# Patient Record
Sex: Female | Born: 1985 | Hispanic: Yes | Marital: Married | Smoking: Never smoker
Health system: Southern US, Community
[De-identification: ages and names within clinical notes are randomized; demographics above are authoritative.]

## PROBLEM LIST (undated history)

## (undated) DIAGNOSIS — Z789 Other specified health status: Secondary | ICD-10-CM

## (undated) HISTORY — PX: NO PAST SURGERIES: SHX2092

## (undated) HISTORY — PX: WISDOM TOOTH EXTRACTION: SHX21

---

## 2006-02-28 ENCOUNTER — Emergency Department (HOSPITAL_COMMUNITY): Admission: EM | Admit: 2006-02-28 | Discharge: 2006-02-28 | Payer: Self-pay | Admitting: Emergency Medicine

## 2007-10-14 ENCOUNTER — Ambulatory Visit: Payer: Self-pay | Admitting: Family Medicine

## 2007-10-14 ENCOUNTER — Encounter (INDEPENDENT_AMBULATORY_CARE_PROVIDER_SITE_OTHER): Payer: Self-pay | Admitting: Internal Medicine

## 2007-10-14 ENCOUNTER — Ambulatory Visit: Payer: Self-pay | Admitting: *Deleted

## 2007-10-14 LAB — CONVERTED CEMR LAB
AST: 31 units/L (ref 0–37)
Alkaline Phosphatase: 65 units/L (ref 39–117)
BUN: 20 mg/dL (ref 6–23)
Basophils Absolute: 0 10*3/uL (ref 0.0–0.1)
CO2: 24 meq/L (ref 19–32)
Creatinine, Ser: 0.75 mg/dL (ref 0.40–1.20)
Eosinophils Relative: 3 % (ref 0–5)
HCT: 40.4 % (ref 36.0–46.0)
LDL Cholesterol: 92 mg/dL (ref 0–99)
Lymphocytes Relative: 35 % (ref 12–46)
MCHC: 33.4 g/dL (ref 30.0–36.0)
MCV: 80.2 fL (ref 78.0–100.0)
Monocytes Absolute: 0.7 10*3/uL (ref 0.1–1.0)
Monocytes Relative: 9 % (ref 3–12)
Platelets: 273 10*3/uL (ref 150–400)
RDW: 13 % (ref 11.5–15.5)
Sodium: 139 meq/L (ref 135–145)
Total Bilirubin: 0.7 mg/dL (ref 0.3–1.2)
Total Protein: 7.8 g/dL (ref 6.0–8.3)
VLDL: 17 mg/dL (ref 0–40)

## 2007-10-25 ENCOUNTER — Ambulatory Visit (HOSPITAL_COMMUNITY): Admission: RE | Admit: 2007-10-25 | Discharge: 2007-10-25 | Payer: Self-pay | Admitting: Family Medicine

## 2007-11-20 ENCOUNTER — Ambulatory Visit: Payer: Self-pay | Admitting: Internal Medicine

## 2008-01-10 ENCOUNTER — Ambulatory Visit: Payer: Self-pay | Admitting: Internal Medicine

## 2008-03-24 ENCOUNTER — Ambulatory Visit: Payer: Self-pay | Admitting: Internal Medicine

## 2008-09-14 ENCOUNTER — Ambulatory Visit: Payer: Self-pay | Admitting: Internal Medicine

## 2009-03-03 ENCOUNTER — Ambulatory Visit: Payer: Self-pay | Admitting: Internal Medicine

## 2009-05-07 ENCOUNTER — Ambulatory Visit: Payer: Self-pay | Admitting: Family Medicine

## 2009-05-17 ENCOUNTER — Encounter: Admission: RE | Admit: 2009-05-17 | Discharge: 2009-05-17 | Payer: Self-pay | Admitting: Family Medicine

## 2013-03-18 ENCOUNTER — Ambulatory Visit: Payer: Self-pay | Attending: Internal Medicine

## 2013-04-01 ENCOUNTER — Ambulatory Visit: Payer: Self-pay

## 2013-04-14 ENCOUNTER — Ambulatory Visit: Payer: No Typology Code available for payment source | Attending: Family Medicine | Admitting: Internal Medicine

## 2013-04-14 VITALS — BP 116/75 | HR 70 | Temp 98.5°F | Resp 18 | Wt 151.8 lb

## 2013-04-14 DIAGNOSIS — R21 Rash and other nonspecific skin eruption: Secondary | ICD-10-CM | POA: Insufficient documentation

## 2013-04-14 DIAGNOSIS — Z23 Encounter for immunization: Secondary | ICD-10-CM

## 2013-04-14 LAB — CBC
Hemoglobin: 13.6 g/dL (ref 12.0–15.0)
MCH: 26.6 pg (ref 26.0–34.0)
MCHC: 33.7 g/dL (ref 30.0–36.0)
Platelets: 287 10*3/uL (ref 150–400)
RBC: 5.12 MIL/uL — ABNORMAL HIGH (ref 3.87–5.11)
RDW: 13.9 % (ref 11.5–15.5)

## 2013-04-14 LAB — BASIC METABOLIC PANEL
CO2: 27 mEq/L (ref 19–32)
Creat: 0.57 mg/dL (ref 0.50–1.10)
Glucose, Bld: 90 mg/dL (ref 70–99)
Sodium: 138 mEq/L (ref 135–145)

## 2013-04-14 NOTE — Progress Notes (Signed)
Patient ID: Krystal Dixon, female   DOB: 11/12/1985, 27 y.o.   MRN: [id number] Patient Demographics  Krystal Dixon, is a 27 y.o. female  CSN: [id number]  MRN: [id number]  DOB - [birth year]  Outpatient Primary MD for the patient is No primary provider on file.   With History of -  No past medical or surgical history  in for   Chief Complaint  Patient presents with  . Rash     HPI  Krystal Dixon  is a 27 y.o. female, with no previous medical or surgical issues, on no medications comes in for a few pin head size skin-colored rash on her forehead which happens when she is exposed to sun and heat, no puslike discharge no redness no warmth, she has no history of acne, she has no other subjective problems.    Review of Systems    In addition to the HPI above,  No Fever-chills, No Headache, No changes with Vision or hearing, No problems swallowing food or Liquids, No Chest pain, Cough or Shortness of Breath, No Abdominal pain, No Nausea or Vommitting, Bowel movements are regular, No Blood in stool or Urine, No dysuria, No new skin rashes or bruises, except as above No new joints pains-aches,  No new weakness, tingling, numbness in any extremity, No recent weight gain or loss, No polyuria, polydypsia or polyphagia, No significant Mental Stressors.  A full 10 point Review of Systems was done, except as stated above, all other Review of Systems were negative.   Social History History  Substance Use Topics  . Smoking status: Never Smoker   . Smokeless tobacco: Not on file  . Alcohol Use: Not on file      Family History DM-2  Prior to Admission medications   Not on File    No Known Allergies  Physical Exam  Vitals  Blood pressure 116/75, pulse 70, temperature 98.5 F (36.9 C), resp. rate 18, weight 151 lb 12.8 oz (68.856 kg), SpO2 100.00%.   1. General young Hispanic female sitting on clinic examination table in no apparent distress,     2.  Normal affect and insight, Not Suicidal or Homicidal, Awake Alert, Oriented X 3.  3. No F.N deficits, ALL C.Nerves Intact, Strength 5/5 all 4 extremities, Sensation intact all 4 extremities, Plantars down going.  4. Ears and Eyes appear Normal, Conjunctivae clear, PERRLA. Moist Oral Mucosa.  5. Supple Neck, No JVD, No cervical lymphadenopathy appriciated, No Carotid Bruits.  6. Symmetrical Chest wall movement, Good air movement bilaterally, CTAB.  7. RRR, No Gallops, Rubs or Murmurs, No Parasternal Heave.  8. Positive Bowel Sounds, Abdomen Soft, Non tender, No organomegaly appriciated,No rebound -guarding or rigidity.  9.  No Cyanosis, Normal Skin Turgor, No Skin Rash or Bruise, few tiny pin head size skin colored eruptions on the forehead, no surrounding erythema no pustules  10. Good muscle tone,  joints appear normal , no effusions, Normal ROM.  11. No Palpable Lymph Nodes in Neck or Axillae     Data Review  CBC No results found for this basename: WBC, HGB, HCT, PLT, MCV, MCH, MCHC, RDW, NEUTRABS, LYMPHSABS, MONOABS, EOSABS, BASOSABS, BANDABS, BANDSABD,  in the last 168 hours ------------------------------------------------------------------------------------------------------------------  Chemistries  No results found for this basename: NA, K, CL, CO2, GLUCOSE, BUN, CREATININE, GFRCGP, CALCIUM, MG, AST, ALT, ALKPHOS, BILITOT,  in the last 168 hours ------------------------------------------------------------------------------------------------------------------ CrCl is unknown because there is no height on file for the current visit. ------------------------------------------------------------------------------------------------------------------ No results found for this basename:  TSH, T4TOTAL, FREET3, T3FREE, THYROIDAB,  in the last 72 hours   Coagulation profile No results found for this basename: INR, PROTIME,  in the last 168  hours ------------------------------------------------------------------------------------------------------------------- No results found for this basename: DDIMER,  in the last 72 hours -------------------------------------------------------------------------------------------------------------------  Cardiac Enzymes No results found for this basename: CK, CKMB, TROPONINI, MYOGLOBIN,  in the last 168 hours ------------------------------------------------------------------------------------------------------------------ No components found with this basename: POCBNP,    ---------------------------------------------------------------------------------------------------------------  Urinalysis No results found for this basename: colorurine, appearanceur, labspec, phurine, glucoseu, hgbur, bilirubinur, ketonesur, proteinur, urobilinogen, nitrite, leukocytesur       Assessment and plan  Heat eruptions on forehead- no acute issues these are not pustules these are not acne, patient has oily skin and has been recommended to use soap and water to keep her skin all free 2-3 times a day.   Routine health maintenance.   Screening CBC BMP and A1c have been ordered  Pap smear - referral made    Immunizations Flu Shot        Leroy Sea M.D on 04/14/2013 at 9:20 AM

## 2013-04-14 NOTE — Progress Notes (Signed)
Patient is here to establish care Complains of rash to face- has been there a while

## 2013-04-16 NOTE — Progress Notes (Signed)
Quick Note:  A1c is pending from last visit, please get it done. ______

## 2013-06-30 ENCOUNTER — Encounter: Payer: Self-pay | Admitting: Obstetrics & Gynecology

## 2013-08-14 ENCOUNTER — Encounter: Payer: Self-pay | Admitting: Obstetrics & Gynecology

## 2014-07-17 ENCOUNTER — Inpatient Hospital Stay (HOSPITAL_COMMUNITY): Payer: Self-pay

## 2014-07-17 ENCOUNTER — Encounter (HOSPITAL_COMMUNITY): Payer: Self-pay | Admitting: *Deleted

## 2014-07-17 ENCOUNTER — Inpatient Hospital Stay (HOSPITAL_COMMUNITY)
Admission: AD | Admit: 2014-07-17 | Discharge: 2014-07-17 | Disposition: A | Discharge status: Home / Self Care | Payer: Self-pay | Source: Ambulatory Visit | Attending: Obstetrics and Gynecology | Admitting: Obstetrics and Gynecology

## 2014-07-17 DIAGNOSIS — O26891 Other specified pregnancy related conditions, first trimester: Secondary | ICD-10-CM

## 2014-07-17 DIAGNOSIS — N949 Unspecified condition associated with female genital organs and menstrual cycle: Secondary | ICD-10-CM | POA: Insufficient documentation

## 2014-07-17 DIAGNOSIS — O4691 Antepartum hemorrhage, unspecified, first trimester: Secondary | ICD-10-CM

## 2014-07-17 DIAGNOSIS — Z3A08 8 weeks gestation of pregnancy: Secondary | ICD-10-CM | POA: Insufficient documentation

## 2014-07-17 DIAGNOSIS — O209 Hemorrhage in early pregnancy, unspecified: Secondary | ICD-10-CM | POA: Insufficient documentation

## 2014-07-17 DIAGNOSIS — R102 Pelvic and perineal pain: Secondary | ICD-10-CM

## 2014-07-17 DIAGNOSIS — R1030 Lower abdominal pain, unspecified: Secondary | ICD-10-CM | POA: Insufficient documentation

## 2014-07-17 HISTORY — DX: Other specified health status: Z78.9

## 2014-07-17 LAB — URINALYSIS, ROUTINE W REFLEX MICROSCOPIC
Bilirubin Urine: NEGATIVE
Glucose, UA: NEGATIVE mg/dL
Ketones, ur: NEGATIVE mg/dL
Nitrite: NEGATIVE
PH: 7.5 (ref 5.0–8.0)
PROTEIN: 30 mg/dL — AB
Specific Gravity, Urine: 1.01 (ref 1.005–1.030)
UROBILINOGEN UA: 0.2 mg/dL (ref 0.0–1.0)

## 2014-07-17 LAB — POCT PREGNANCY, URINE: PREG TEST UR: POSITIVE — AB

## 2014-07-17 LAB — URINE MICROSCOPIC-ADD ON

## 2014-07-17 LAB — CBC
HEMATOCRIT: 39.4 % (ref 36.0–46.0)
Hemoglobin: 13.5 g/dL (ref 12.0–15.0)
MCH: 27.8 pg (ref 26.0–34.0)
MCHC: 34.3 g/dL (ref 30.0–36.0)
MCV: 81.2 fL (ref 78.0–100.0)
Platelets: 257 10*3/uL (ref 150–400)
RBC: 4.85 MIL/uL (ref 3.87–5.11)
RDW: 13.3 % (ref 11.5–15.5)
WBC: 11.5 10*3/uL — AB (ref 4.0–10.5)

## 2014-07-17 LAB — WET PREP, GENITAL
Clue Cells Wet Prep HPF POC: NONE SEEN
Trich, Wet Prep: NONE SEEN
YEAST WET PREP: NONE SEEN

## 2014-07-17 LAB — HCG, QUANTITATIVE, PREGNANCY: HCG, BETA CHAIN, QUANT, S: 439 m[IU]/mL — AB (ref <5)

## 2014-07-17 LAB — ABO/RH: ABO/RH(D): O POS

## 2014-07-17 LAB — HIV ANTIBODY (ROUTINE TESTING W REFLEX): HIV: NONREACTIVE

## 2014-07-17 NOTE — MAU Provider Note (Signed)
History     CSN: [id number]  Arrival date and time: 07/17/14 1326   First Provider Initiated Contact with Patient 07/17/14 1632      Chief Complaint  Patient presents with  . Vaginal Bleeding   HPI Krystal Dixon is a 28 y.o. G1P0 at 6380w2d who presents today with lower abdominal pain and bleeding. She states that the pain started yesterday, and she had some light bleeding. However, today the bleeding is heavier, and the pain has increased. She has not started prenatal care at this time.   Past Medical History  Diagnosis Date  . Medical history non-contributory     Past Surgical History  Procedure Laterality Date  . No past surgeries      No family history on file.  History  Substance Use Topics  . Smoking status: Never Smoker   . Smokeless tobacco: Not on file  . Alcohol Use: No    Allergies: No Known Allergies  Prescriptions prior to admission  Medication Sig Dispense Refill Last Dose  . amoxicillin (AMOXIL) 500 MG tablet Take 500 mg by mouth 3 (three) times daily.   07/17/2014 at Unknown time  . Prenatal Vit-Fe Fumarate-FA (PRENATAL MULTIVITAMIN) TABS tablet Take 1 tablet by mouth daily at 12 noon.   07/16/2014 at Unknown time    ROS Physical Exam   Blood pressure 121/70, pulse 66, temperature 98.1 F (36.7 C), temperature source Oral, resp. rate 16, height 5\' 2"  (1.575 m), weight 71.215 kg (157 lb), last menstrual period 05/20/2014.  Physical Exam  Nursing note and vitals reviewed. Constitutional: She is oriented to person, place, and time. She appears well-developed and well-nourished. No distress.  Cardiovascular: Normal rate.   Respiratory: Effort normal.  GI: Soft. There is no tenderness. There is no rebound.  Genitourinary:   External: no lesion Vagina: small amount of blood seen. Small piece of clot/tissue? Will send to path.  Cervix: pink, smooth, no CMT Uterus: NSSC Adnexa: NT   Neurological: She is alert and oriented to person, place, and  time.  Skin: Skin is warm and dry.  Psychiatric: She has a normal mood and affect.    MAU Course  Procedures  Results for orders placed or performed during the hospital encounter of 07/17/14 (from the past 24 hour(s))  Urinalysis, Routine w reflex microscopic     Status: Abnormal   Collection Time: 07/17/14  3:08 PM  Result Value Ref Range   Color, Urine RED (A) YELLOW   APPearance HAZY (A) CLEAR   Specific Gravity, Urine 1.010 1.005 - 1.030   pH 7.5 5.0 - 8.0   Glucose, UA NEGATIVE NEGATIVE mg/dL   Hgb urine dipstick LARGE (A) NEGATIVE   Bilirubin Urine NEGATIVE NEGATIVE   Ketones, ur NEGATIVE NEGATIVE mg/dL   Protein, ur 30 (A) NEGATIVE mg/dL   Urobilinogen, UA 0.2 0.0 - 1.0 mg/dL   Nitrite NEGATIVE NEGATIVE   Leukocytes, UA TRACE (A) NEGATIVE  Urine microscopic-add on     Status: Abnormal   Collection Time: 07/17/14  3:08 PM  Result Value Ref Range   Squamous Epithelial / LPF FEW (A) RARE   RBC / HPF TOO NUMEROUS TO COUNT <3 RBC/hpf   Bacteria, UA FEW (A) RARE   Urine-Other FIELD OBSCURED BY RBC'S   Pregnancy, urine POC     Status: Abnormal   Collection Time: 07/17/14  3:15 PM  Result Value Ref Range   Preg Test, Ur POSITIVE (A) NEGATIVE  CBC     Status: Abnormal  Collection Time: 07/17/14  4:14 PM  Result Value Ref Range   WBC 11.5 (H) 4.0 - 10.5 K/uL   RBC 4.85 3.87 - 5.11 MIL/uL   Hemoglobin 13.5 12.0 - 15.0 g/dL   HCT 96.239.4 95.236.0 - 84.146.0 %   MCV 81.2 78.0 - 100.0 fL   MCH 27.8 26.0 - 34.0 pg   MCHC 34.3 30.0 - 36.0 g/dL   RDW 32.413.3 40.111.5 - 02.715.5 %   Platelets 257 150 - 400 K/uL  ABO/Rh     Status: None (Preliminary result)   Collection Time: 07/17/14  4:14 PM  Result Value Ref Range   ABO/RH(D) O POS   hCG, quantitative, pregnancy     Status: Abnormal   Collection Time: 07/17/14  4:14 PM  Result Value Ref Range   hCG, Beta Chain, Quant, S 439 (H) <5 mIU/mL  Wet prep, genital     Status: Abnormal   Collection Time: 07/17/14  4:40 PM  Result Value Ref Range    Yeast Wet Prep HPF POC NONE SEEN NONE SEEN   Trich, Wet Prep NONE SEEN NONE SEEN   Clue Cells Wet Prep HPF POC NONE SEEN NONE SEEN   WBC, Wet Prep HPF POC FEW (A) NONE SEEN   [place] Ob Comp Less 14 Wks  07/17/2014   CLINICAL DATA:  28 year old female reportedly 8 weeks 2 days pregnant with pelvic pain and bleeding. The quantitative beta HCG is 439  EXAM: OBSTETRIC <14 WK US AND TRANSVAGINAL OB US  TECHNIQUE: Both transabdominal and transvaginal ultrasound examinations were performed for complete evaluation of the gestation as well as the maternal uterus, adnexal regions, and pelvic cul-de-sac. Transvaginal technique was performed to assess early pregnancy.  COMPARISON:  None.  FINDINGS: Intrauterine gestational sac: None identified  Yolk sac:  Not present  Embryo:  Not present  Maternal uterus/adnexae: No free fluid. Unremarkable ovaries bilaterally.  IMPRESSION: No intrauterine pregnancy or gestational sac is identified. Differential considerations include very early IUP, failed IUP and ectopic pregnancy. Recommend clinical correlation with repeat beta HCG in 48 hr.   Electronically Signed   By: Malachy MoanHeath  McCullough M.D.   On: 07/17/2014 17:20   [place] Ob Transvaginal  07/17/2014   CLINICAL DATA:  28 year old female reportedly 8 weeks 2 days pregnant with pelvic pain and bleeding. The quantitative beta HCG is 439  EXAM: OBSTETRIC <14 WK US AND TRANSVAGINAL OB US  TECHNIQUE: Both transabdominal and transvaginal ultrasound examinations were performed for complete evaluation of the gestation as well as the maternal uterus, adnexal regions, and pelvic cul-de-sac. Transvaginal technique was performed to assess early pregnancy.  COMPARISON:  None.  FINDINGS: Intrauterine gestational sac: None identified  Yolk sac:  Not present  Embryo:  Not present  Maternal uterus/adnexae: No free fluid. Unremarkable ovaries bilaterally.  IMPRESSION: No intrauterine pregnancy or gestational sac is identified. Differential considerations  include very early IUP, failed IUP and ectopic pregnancy. Recommend clinical correlation with repeat beta HCG in 48 hr.   Electronically Signed   By: Malachy MoanHeath  McCullough M.D.   On: 07/17/2014 17:20     Assessment and Plan   1. Vaginal bleeding in pregnancy, first trimester   2. Pelvic pain affecting pregnancy in first trimester, antepartum    Bleeding precautions D/W the patient and her family at length that this is most likely a miscarriage.  All questions answered  Follow-up Information    [facility] [place] [facility] In 2 days.   Why:  SUNDAY 07/19/14 for repeat blood work.    Contact information:   [address] [id number] mc [place] [address] [phone]       Tawnya Crook 07/17/2014, 4:34 PM

## 2014-07-17 NOTE — MAU Note (Signed)
Pt went to clinic on [address] this am, was told she had an infection and was given amoxicillin. Pt unsure what meds are for. Does have burning and urgency with voiding.

## 2014-07-17 NOTE — Discharge Instructions (Signed)
Amenaza de aborto (Threatened Miscarriage) La amenaza de aborto se produce cuando hay hemorragia vaginal durante las primeras 20semanas de embarazo, pero el embarazo no se interrumpe. Si durante este perodo usted tiene hemorragia vaginal, el mdico le har pruebas para asegurarse de que el embarazo contine. Si las pruebas muestran que usted contina embarazada y que el "beb" en desarrollo (feto) dentro del tero sigue creciendo, se considera que tuvo una amenaza de aborto. La amenaza de aborto no implica que el embarazo vaya a terminar, pero s aumenta el riesgo de perder el embarazo (aborto completo). CAUSAS  Por lo general, no se conoce la causa de la amenaza de aborto. Si el resultado final es el aborto completo, la causa ms frecuente es la cantidad anormal de cromosomas del feto. Los cromosomas son las estructuras internas de las clulas que contienen todo el material gentico. Algunas de las causas de hemorragia vaginal que no ocasionan un aborto incluyen:  Las relaciones sexuales.  Las infecciones.  Los cambios hormonales normales durante el embarazo.  La hemorragia que se produce cuando el vulo se implanta en el tero. FACTORES DE RIESGO Los factores de riesgo de hemorragia al principio del embarazo incluyen:  Obesidad.  Fumar.  El consumo de cantidades excesivas de alcohol o cafena.  El consumo de drogas. SIGNOS Y SNTOMAS  Hemorragia vaginal leve.  Dolor o clicos abdominales leves. DIAGNSTICO  Si tiene hemorragia con o sin dolor abdominal antes de las 20semanas de embarazo, el mdico le har pruebas para determinar si el embarazo contina. Una prueba importante incluye el uso de ondas sonoras y de una computadora (ecografa) para crear imgenes del interior del tero. Otras pruebas incluyen el examen interno de la vagina y el tero (examen plvico), y el control de la frecuencia cardaca del feto.  Es posible que le diagnostiquen una amenaza de aborto en los  siguientes casos:  La ecografa muestra que el embarazo contina.  La frecuencia cardaca del feto es alta.  El examen plvico muestra que la apertura entre el tero y la vagina (cuello del tero) est cerrada.  Su frecuencia cardaca y su presin arterial estn estables.  Los anlisis de sangre confirman que el embarazo contina. TRATAMIENTO  No se ha demostrado que ningn tratamiento evite que una amenaza de aborto se convierta en un aborto completo. Sin embargo, los cuidados adecuados en el hogar son importantes.  INSTRUCCIONES PARA EL CUIDADO EN EL HOGAR   Asegrese de asistir a todas las citas de cuidados prenatales. Esto es muy importante.  Descanse lo suficiente.  No tenga relaciones sexuales ni use tampones si tiene hemorragia vaginal.  No se haga duchas vaginales.  No fume ni consuma drogas.  No beba alcohol.  Evite la cafena. SOLICITE ATENCIN MDICA SI:  Tiene una ligera hemorragia o manchado vaginal durante el embarazo.  Tiene dolor o clicos en el abdomen.  Tiene fiebre. SOLICITE ATENCIN MDICA DE INMEDIATO SI:  Tiene una hemorragia vaginal abundante.  Elimina cogulos de sangre por la vagina.  Siente dolor en la parte baja de la espalda o clicos abdominales intensos.  Tiene fiebre, escalofros y dolor abdominal intenso. ASEGRESE DE QUE:  Comprende estas instrucciones.  Controlar su afeccin.  Recibir ayuda de inmediato si no mejora o si empeora. Document Released: 05/03/2005 Document Revised: 07/29/2013 Exit[employer] Patient Information 2015 [employer]. This information is not intended to replace advice given to you by your health care provider. Make sure you discuss any questions you have with your health care provider.  

## 2014-07-17 NOTE — MAU Note (Signed)
Pt states here for vaginal bleeding that began last pm. More blood noted today. Has changed panty liner twice today. One liner was saturated with BRB, other not completely full. Lower abdominal pain as well.

## 2014-07-18 LAB — GC/CHLAMYDIA PROBE AMP
CT PROBE, AMP APTIMA: NEGATIVE
GC PROBE AMP APTIMA: NEGATIVE

## 2014-07-19 ENCOUNTER — Inpatient Hospital Stay (HOSPITAL_COMMUNITY)
Admission: AD | Admit: 2014-07-19 | Discharge: 2014-07-19 | Disposition: A | Discharge status: Home / Self Care | Payer: Self-pay | Source: Ambulatory Visit | Attending: Obstetrics and Gynecology | Admitting: Obstetrics and Gynecology

## 2014-07-19 DIAGNOSIS — Z3A08 8 weeks gestation of pregnancy: Secondary | ICD-10-CM | POA: Insufficient documentation

## 2014-07-19 DIAGNOSIS — O039 Complete or unspecified spontaneous abortion without complication: Secondary | ICD-10-CM

## 2014-07-19 DIAGNOSIS — O200 Threatened abortion: Secondary | ICD-10-CM | POA: Insufficient documentation

## 2014-07-19 LAB — HCG, QUANTITATIVE, PREGNANCY: HCG, BETA CHAIN, QUANT, S: 62 m[IU]/mL — AB (ref <5)

## 2014-07-19 NOTE — MAU Note (Signed)
Pt here for repeat HCG. States pain is better and no bleeding.

## 2014-07-19 NOTE — MAU Provider Note (Signed)
  History     CSN: [id number]  Arrival date and time: 07/19/14 1507   None     Chief Complaint  Patient presents with  . Repeat Labs    HPI  Krystal Dixon is a 28 y.o. G1P0 at 411w4d. She had MAU visit 12/11 for cramping and bleeding. BHCG was 439, it was thought that she was having a miscarriage. Today her bleeding has almost resolved, light bleeding, no cramping.  OB History    Gravida Para Term Preterm AB TAB SAB Ectopic Multiple Living   1               Past Medical History  Diagnosis Date  . Medical history non-contributory     Past Surgical History  Procedure Laterality Date  . No past surgeries      No family history on file.  History  Substance Use Topics  . Smoking status: Never Smoker   . Smokeless tobacco: Not on file  . Alcohol Use: No    Allergies: No Known Allergies  Prescriptions prior to admission  Medication Sig Dispense Refill Last Dose  . amoxicillin (AMOXIL) 500 MG tablet Take 500 mg by mouth 3 (three) times daily.   07/17/2014 at Unknown time  . Prenatal Vit-Fe Fumarate-FA (PRENATAL MULTIVITAMIN) TABS tablet Take 1 tablet by mouth daily at 12 noon.   07/16/2014 at Unknown time    Review of Systems  Constitutional: Negative for fever and chills.  Gastrointestinal: Negative for abdominal pain.  Genitourinary: Negative for dysuria, urgency and frequency.       Light bleeding   Physical Exam   Blood pressure 111/69, pulse 70, temperature 98.3 F (36.8 C), temperature source Oral, resp. rate 18, last menstrual period 05/20/2014.  Physical Exam  Vitals reviewed. Constitutional: She is oriented to person, place, and time. She appears well-developed and well-nourished.  Musculoskeletal: Normal range of motion.  Neurological: She is alert and oriented to person, place, and time.  Psychiatric: She has a normal mood and affect. Her behavior is normal.    MAU Course  Procedures  MDM Results for orders placed or performed during the  hospital encounter of 07/19/14 (from the past 24 hour(s))  hCG, quantitative, pregnancy     Status: Abnormal   Collection Time: 07/19/14  3:13 PM  Result Value Ref Range   hCG, Beta Chain, Quant, S 62 (H) <5 mIU/mL     Assessment and Plan  Probable complete miscarraige, BHCG's are falling Path not available for ? POC from 12/11 F/U in 2 wks in GYN clinic Precautions reviewed with the pt and her sister in law Start PNV with folic acid, 2-3 nl menses before trying to conceive again  Eveline KetoHARRIS, Krystal Postle M. 07/19/2014, 4:07 PM

## 2014-07-19 NOTE — Discharge Instructions (Signed)
Miscarriage A miscarriage is the sudden loss of an unborn baby (fetus) before the 20th week of pregnancy. Most miscarriages happen in the first 3 months of pregnancy. Sometimes, it happens before a woman even knows she is pregnant. A miscarriage is also called a "spontaneous miscarriage" or "early pregnancy loss." Having a miscarriage can be an emotional experience. Talk with your caregiver about any questions you may have about miscarrying, the grieving process, and your future pregnancy plans. CAUSES   Problems with the fetal chromosomes that make it impossible for the baby to develop normally. Problems with the baby's genes or chromosomes are most often the result of errors that occur, by chance, as the embryo divides and grows. The problems are not inherited from the parents.  Infection of the cervix or uterus.   Hormone problems.   Problems with the cervix, such as having an incompetent cervix. This is when the tissue in the cervix is not strong enough to hold the pregnancy.   Problems with the uterus, such as an abnormally shaped uterus, uterine fibroids, or congenital abnormalities.   Certain medical conditions.   Smoking, drinking alcohol, or taking illegal drugs.   Trauma.  Often, the cause of a miscarriage is unknown.  SYMPTOMS   Vaginal bleeding or spotting, with or without cramps or pain.  Pain or cramping in the abdomen or lower back.  Passing fluid, tissue, or blood clots from the vagina. DIAGNOSIS  Your caregiver will perform a physical exam. You may also have an ultrasound to confirm the miscarriage. Blood or urine tests may also be ordered. TREATMENT   Sometimes, treatment is not necessary if you naturally pass all the fetal tissue that was in the uterus. If some of the fetus or placenta remains in the body (incomplete miscarriage), tissue left behind may become infected and must be removed. Usually, a dilation and curettage (D and C) procedure is performed.  During a D and C procedure, the cervix is widened (dilated) and any remaining fetal or placental tissue is gently removed from the uterus.  Antibiotic medicines are prescribed if there is an infection. Other medicines may be given to reduce the size of the uterus (contract) if there is a lot of bleeding.  If you have Rh negative blood and your baby was Rh positive, you will need a Rh immunoglobulin shot. This shot will protect any future baby from having Rh blood problems in future pregnancies. HOME CARE INSTRUCTIONS   Your caregiver may order bed rest or may allow you to continue light activity. Resume activity as directed by your caregiver.  Have someone help with home and family responsibilities during this time.   Keep track of the number of sanitary pads you use each day and how soaked (saturated) they are. Write down this information.   Do not use tampons. Do not douche or have sexual intercourse until approved by your caregiver.   Only take over-the-counter or prescription medicines for pain or discomfort as directed by your caregiver.   Do not take aspirin. Aspirin can cause bleeding.   Keep all follow-up appointments with your caregiver.   If you or your partner have problems with grieving, talk to your caregiver or seek counseling to help cope with the pregnancy loss. Allow enough time to grieve before trying to get pregnant again.  SEEK IMMEDIATE MEDICAL CARE IF:   You have severe cramps or pain in your back or abdomen.  You have a fever.  You pass large blood clots (walnut-sized   or larger) ortissue from your vagina. Save any tissue for your caregiver to inspect.   Your bleeding increases.   You have a thick, bad-smelling vaginal discharge.  You become lightheaded, weak, or you faint.   You have chills.  MAKE SURE YOU:  Understand these instructions.  Will watch your condition.  Will get help right away if you are not doing well or get  worse. Document Released: 01/17/2001 Document Revised: 11/18/2012 Document Reviewed: 09/12/2011 Exit[employer] Patient Information 2015 [employer]. This information is not intended to replace advice given to you by your health care provider. Make sure you discuss any questions you have with your health care provider.  

## 2014-09-30 ENCOUNTER — Ambulatory Visit (INDEPENDENT_AMBULATORY_CARE_PROVIDER_SITE_OTHER): Payer: BLUE CROSS/BLUE SHIELD | Admitting: Physician Assistant

## 2014-09-30 VITALS — BP 112/72 | HR 88 | Temp 97.9°F | Resp 16 | Ht 62.5 in | Wt 161.4 lb

## 2014-09-30 DIAGNOSIS — L309 Dermatitis, unspecified: Secondary | ICD-10-CM

## 2014-09-30 DIAGNOSIS — R102 Pelvic and perineal pain: Secondary | ICD-10-CM

## 2014-09-30 DIAGNOSIS — R350 Frequency of micturition: Secondary | ICD-10-CM

## 2014-09-30 DIAGNOSIS — N9489 Other specified conditions associated with female genital organs and menstrual cycle: Secondary | ICD-10-CM

## 2014-09-30 LAB — POCT UA - MICROSCOPIC ONLY
Casts, Ur, LPF, POC: NEGATIVE
Crystals, Ur, HPF, POC: NEGATIVE
Mucus, UA: NEGATIVE
YEAST UA: NEGATIVE

## 2014-09-30 LAB — POCT URINALYSIS DIPSTICK
BILIRUBIN UA: NEGATIVE
GLUCOSE UA: NEGATIVE
LEUKOCYTES UA: NEGATIVE
NITRITE UA: NEGATIVE
Protein, UA: NEGATIVE
Spec Grav, UA: 1.025
Urobilinogen, UA: 0.2
pH, UA: 5.5

## 2014-09-30 LAB — POCT WET PREP WITH KOH
CLUE CELLS WET PREP PER HPF POC: NEGATIVE
KOH PREP POC: NEGATIVE
RBC WET PREP PER HPF POC: NEGATIVE
Trichomonas, UA: NEGATIVE
Yeast Wet Prep HPF POC: NEGATIVE

## 2014-09-30 MED ORDER — HYDROCORTISONE 0.5 % EX CREA: 1.0000 "application " | TOPICAL_CREAM | Freq: Two times a day (BID) | CUTANEOUS | Status: DC

## 2014-09-30 NOTE — Patient Instructions (Signed)
Eczema (Eczema) El eczema, tambin llamada dermatitis atpica, es una afeccin de la piel que causa inflamacin de la misma. Este trastorno produce una erupcin roja y sequedad y escamas en la piel. Hay gran picazn. El eczema generalmente empeora durante los meses fros del invierno y generalmente desaparece o mejora con el tiempo clido del verano. El eczema generalmente comienza a manifestarse en la infancia. Algunos nios desarrollan este trastorno y ste puede prolongarse en la Facilities manager.  CAUSAS  La causa exacta no se conoce pero parece ser una afeccin hereditaria. Generalmente las personas que sufren eczema tienen una historia familiar de eczema, alergias, asma o fiebre de heno. Esta enfermedad no es contagiosa. Algunas causas de los brotes pueden ser:   Contacto con alguna cosa a la que es sensible o Air cabin crew.  Psychologist, forensic. SIGNOS Y SNTOMAS  Piel seca y escamosa.  Erupcin roja y que pica.  Picazn. Esta puede ocurrir antes de que aparezca la erupcin y puede ser muy intensa. DIAGNSTICO  El diagnstico de eczema se realiza basndose en los sntomas y en la historia clnica. TRATAMIENTO  El eczema no puede curarse, pero los sntomas generalmente pueden controlarse con tratamiento y Teacher, music. Un plan de tratamiento puede incluir:  Control de la picazn y el rascado.  Utilice antihistamnicos de venta libre segn las indicaciones, para Barrister's clerk. Es especialmente til por las noches cuando la picazn tiende a Copy.  Utilice medicamentos de venta libre para la picazn, segn las indicaciones del mdico.  Evite rascarse. El rascado hace que la picazn empeore. Tambin puede producir una infeccin en la piel (imptigo) debido a las lesiones en la piel causadas por el rascado.  Mantenga la piel bien humectada con cremas, todos [place]. La piel quedar hmeda y ayudar a prevenir la sequedad. Las lociones que contengan alcohol y agua deben evitarse debido a que pueden  Advice worker.  Limite la exposicin a las cosas a las que es sensible o alrgico (alrgenos).  Reconozca las situaciones que puedan causar estrs.  Desarrolle un plan para controlar el estrs. [place] slo medicamentos de venta libre o recetados, segn las indicaciones del mdico.  No aplique nada sobre la piel sin Teacher, adult education a su mdico.  Deber tomar baos o duchas de corta duracin (5 minutos) en agua tibia (no caliente). Use jabones suaves para el bao. No deben tener perfume. Puede agregar aceite de bao no perfumado al agua del bao. Es Dispensing optician el jabn y el bao de espuma.  Inmediatamente despus del bao o de la ducha, cuando la piel aun est hmeda, aplique una crema humectante en todo el cuerpo. Este ungento debe ser en base a vaselina. La piel quedar hmeda y ayudar a prevenir la sequedad. Cuanto ms espeso sea el ungento, mejor. No deben tener perfume.  [place] uas cortas. Es posible que los nios con eczema necesiten usar guantes o mitones por la noche, despus de aplicarse el ungento.  Vista al [employer] con ropa de algodn o International aid/development worker de algodn. Vstalo con ropas ligeras ya que el calor aumenta la picazn.  Un nio con eczema debe permanecer alejado de personas que tengan ampollas febriles o llagas del resfro. El virus que causa las ampollas febriles (herpes simple) puede ocasionar una infeccin grave en la piel de los nios que padecen eczema. SOLICITE ATENCIN MDICA SI:   La picazn le impide dormir.  La erupcin empeora o no mejora dentro de Best boy en  la que se inicia el tratamiento.  Observa pus o costras amarillas en la zona de la erupcin.  Tiene fiebre.  Aparece un brote despus de haber estado en contacto con alguna persona que tiene ampollas febriles. Document Released: 07/24/2005 Document Revised: 05/14/2013 ExitCare Patient Information 2015 [employer]. This information is not intended to replace  advice given to you by your health care provider. Make sure you discuss any questions you have with your health care provider.  

## 2014-09-30 NOTE — Progress Notes (Signed)
Subjective:    Patient ID: Krystal Dixon, female    DOB: 11-Jun-1986, 29 y.o.   MRN: [id number]  HPI Patient presents for pelvic pressure and a rash. Pelvic pressure has been present for a few days and is accompanied by frequency, urgency, and incomplete emptying. Denies fever, hematuria, abdominal/back pain, vaginal d/c, or dyspareunia. Sexually active and using condoms and does not want STD screening at this time. Came into clinic bc had miscarriage in Dec and wanted to make sure nothing additional was wrong. No instrumentation used following miscarriage. LMP 09/15/14 was regular.   Rash has been present for 3 weeks and itches. Located on arms, neck, and left shoulder. Denies fever or pain. No h/o eczema, allergies, or asthma. Uses scented soaps and lotion.    Review of Systems  Constitutional: Negative for fever, chills and appetite change.  Gastrointestinal: Negative for nausea, vomiting and abdominal pain.  Genitourinary: Positive for urgency, frequency, decreased urine volume and pelvic pain (pressure). Negative for dysuria, hematuria, flank pain, vaginal bleeding, vaginal discharge, vaginal pain, menstrual problem and dyspareunia.  Musculoskeletal: Negative for back pain.  Skin: Positive for rash. Negative for wound.      Objective:   Physical Exam  Constitutional: She appears well-developed and well-nourished. No distress.  Blood pressure 112/72, pulse 88, temperature 97.9 F (36.6 C), temperature source Oral, resp. rate 16, height 5' 2.5" (1.588 m), weight 161 lb 6 oz (73.199 kg), last menstrual period 09/15/2014, SpO2 97 %, not currently breastfeeding.  HENT:  Head: Normocephalic and atraumatic.  Right Ear: External ear normal.  Left Ear: External ear normal.  Abdominal: Soft. Bowel sounds are normal. There is no tenderness. There is no CVA tenderness. No hernia. Hernia confirmed negative in the right inguinal area and confirmed negative in the left inguinal area.    Genitourinary: Uterus normal. There is no rash, tenderness, lesion or injury on the right labia. There is no rash, tenderness, lesion or injury on the left labia. Cervix exhibits no motion tenderness, no discharge and no friability. Right adnexum displays no mass, no tenderness and no fullness. Left adnexum displays no mass, no tenderness and no fullness. No erythema, tenderness or bleeding in the vagina. No foreign body around the vagina. No signs of injury around the vagina. Vaginal discharge (copius amount of physiologic discharge, clear in color, no odor.) found.    Lymphadenopathy:       Right: No inguinal adenopathy present.       Left: No inguinal adenopathy present.  Skin: Skin is warm, dry and intact. Rash noted. Rash is maculopapular (with scale and erythema). She is not diaphoretic.      Results for orders placed or performed in visit on 09/30/14  POCT urinalysis dipstick  Result Value Ref Range   Color, UA yellow    Clarity, UA clear    Glucose, UA neg    Bilirubin, UA neg    Ketones, UA trace    Spec Grav, UA 1.025    Blood, UA trace    pH, UA 5.5    Protein, UA neg    Urobilinogen, UA 0.2    Nitrite, UA neg    Leukocytes, UA Negative   POCT UA - Microscopic Only  Result Value Ref Range   WBC, Ur, HPF, POC 0-1    RBC, urine, microscopic 1-3    Bacteria, U Microscopic trace    Mucus, UA neg    Epithelial cells, urine per micros 0-2    Crystals, Ur, HPF,  POC neg    Casts, Ur, LPF, POC neg    Yeast, UA neg   POCT Wet Prep with KOH  Result Value Ref Range   Trichomonas, UA Negative    Clue Cells Wet Prep HPF POC neg    Epithelial Wet Prep HPF POC 0-2    Yeast Wet Prep HPF POC neg    Bacteria Wet Prep HPF POC trace    RBC Wet Prep HPF POC neg    WBC Wet Prep HPF POC 0-2    KOH Prep POC Negative         Assessment & Plan:  1. Eczema Discontinue scented soap and lotion. Use Dove unscented soap. Can use Eucerin or Dermasil to stay moisturized.  -  hydrocortisone cream 0.5 %; Apply 1 application topically 2 (two) times daily.  Dispense: 30 g; Refill: 0  2. Urinary frequency - POCT urinalysis dipstick - POCT UA - Microscopic Only  3. Pelvic pressure in female No vaginal abnormalities will wait for GC/pap result for final analysis. Patient declines full STD screen. - POCT Wet Prep with KOH - Pap IG, CT/NG w/ reflex HPV when ASC-U   Janan Ridge PA-C  [facility] 09/30/2014 8:57 PM

## 2014-10-02 LAB — PAP IG, CT-NG, RFX HPV ASCU
Chlamydia Probe Amp: NEGATIVE
GC Probe Amp: NEGATIVE

## 2014-10-05 ENCOUNTER — Encounter: Payer: Self-pay | Admitting: Physician Assistant

## 2014-10-22 ENCOUNTER — Telehealth: Payer: Self-pay

## 2014-10-22 DIAGNOSIS — L309 Dermatitis, unspecified: Secondary | ICD-10-CM

## 2014-10-22 NOTE — Telephone Encounter (Signed)
Pt called in and states she never picked up her script for hydocortisone cream from 09-30-14. She says she had to go out of town on an emergency. She would like it resend it. She can be reached [phone] Thank you

## 2014-10-23 MED ORDER — HYDROCORTISONE 0.5 % EX CREA: 1.0000 "application " | TOPICAL_CREAM | Freq: Two times a day (BID) | CUTANEOUS | Status: DC

## 2014-10-23 NOTE — Telephone Encounter (Signed)
Pt reported that the pharm said they don't still have it. I resent the Rx.

## 2015-08-20 ENCOUNTER — Ambulatory Visit (HOSPITAL_COMMUNITY)
Admission: RE | Admit: 2015-08-20 | Discharge: 2015-08-20 | Disposition: A | Discharge status: Home / Self Care | Payer: BLUE CROSS/BLUE SHIELD | Source: Ambulatory Visit | Attending: Physician Assistant | Admitting: Physician Assistant

## 2015-08-20 ENCOUNTER — Encounter (HOSPITAL_COMMUNITY): Payer: Self-pay

## 2015-08-20 ENCOUNTER — Other Ambulatory Visit: Payer: Self-pay | Admitting: Physician Assistant

## 2015-08-20 ENCOUNTER — Ambulatory Visit (INDEPENDENT_AMBULATORY_CARE_PROVIDER_SITE_OTHER): Payer: BLUE CROSS/BLUE SHIELD | Admitting: Physician Assistant

## 2015-08-20 VITALS — BP 116/64 | HR 79 | Temp 99.3°F | Resp 16 | Ht 63.0 in | Wt 157.0 lb

## 2015-08-20 DIAGNOSIS — R300 Dysuria: Secondary | ICD-10-CM

## 2015-08-20 DIAGNOSIS — Z3200 Encounter for pregnancy test, result unknown: Secondary | ICD-10-CM

## 2015-08-20 DIAGNOSIS — O208 Other hemorrhage in early pregnancy: Secondary | ICD-10-CM | POA: Diagnosis not present

## 2015-08-20 DIAGNOSIS — R102 Pelvic and perineal pain: Secondary | ICD-10-CM | POA: Diagnosis not present

## 2015-08-20 DIAGNOSIS — R109 Unspecified abdominal pain: Secondary | ICD-10-CM

## 2015-08-20 DIAGNOSIS — Z3A01 Less than 8 weeks gestation of pregnancy: Secondary | ICD-10-CM | POA: Diagnosis not present

## 2015-08-20 DIAGNOSIS — O26899 Other specified pregnancy related conditions, unspecified trimester: Secondary | ICD-10-CM

## 2015-08-20 DIAGNOSIS — O26891 Other specified pregnancy related conditions, first trimester: Secondary | ICD-10-CM | POA: Insufficient documentation

## 2015-08-20 LAB — POCT URINE PREGNANCY: Preg Test, Ur: POSITIVE — AB

## 2015-08-20 LAB — POCT URINALYSIS DIP (MANUAL ENTRY)
BILIRUBIN UA: NEGATIVE
BILIRUBIN UA: NEGATIVE
Glucose, UA: NEGATIVE
Leukocytes, UA: NEGATIVE
Nitrite, UA: NEGATIVE
PROTEIN UA: NEGATIVE
Spec Grav, UA: >=1.03
Urobilinogen, UA: 0.2
pH, UA: 5.5

## 2015-08-20 LAB — POC MICROSCOPIC URINALYSIS (UMFC)

## 2015-08-20 LAB — HCG, QUANTITATIVE, PREGNANCY: HCG, BETA CHAIN, QUANT, S: 13677.5 m[IU]/mL — AB

## 2015-08-20 NOTE — Patient Instructions (Signed)
Go to [facility] and register at the U/S department for your outpatient U/S. You will go through the main entrance and you will see where it forks to the left and the right, you will need to fork to the right. You will go down that hallway, to the end, and U/S will be last door on left.   [facility]  [address], [place], [place] [address] [phone]

## 2015-08-20 NOTE — Progress Notes (Signed)
Subjective:    Patient ID: Krystal Dixon, female    DOB: Apr 16, 1986, 30 y.o.   MRN: [id number] Chief Complaint  Patient presents with  . Other    Pregnancy Test   HPI Patient presents today after a positive pregnancy test. She complains of pain in the RLQ and LLQ. She experienced the pain after taking a pregnancy test at home last Thursday, 08/12/15. Last year she experienced a miscarriage, which is why she came in today. The pain is constant and it is aggravated during urination. She is unable to describe what the pain feels like. Notes no relieving factors. Rates the pain as a 4/10.   She states on 08/13/15 she began experiencing dysuria. Her urine is a very dark yellow color with a strong odor. She states she has noticed an increase in the frequency of her urination, about 6-8x/day. Before the onset she would only urinate 3-4x/day. Denies hematuria and sexual activity since onset.    Review of Systems  Constitutional: Negative for fever, chills, appetite change and fatigue.  Respiratory: Negative for shortness of breath.   Cardiovascular: Negative for chest pain and palpitations.  Gastrointestinal: Negative for nausea, vomiting, diarrhea, constipation, blood in stool and abdominal distention.  Genitourinary: Positive for dysuria, frequency (6-7x/day) and pelvic pain (RLQ and LLQ). Negative for urgency, hematuria, flank pain, decreased urine volume, vaginal bleeding, vaginal discharge, difficulty urinating, genital sores and vaginal pain. Enuresis: normal.  Psychiatric/Behavioral: Negative for sleep disturbance.   No Known Allergies  Prior to Admission medications   Not on File   Patient Active Problem List   Diagnosis Date Noted  . Rash and nonspecific skin eruption 04/14/2013   Past Medical History  Diagnosis Date  . Medical history non-contributory        Objective:   Physical Exam  Constitutional: She appears well-developed and well-nourished.  BP 116/64 mmHg   Pulse 79  Temp(Src) 99.3 F (37.4 C)  Resp 16  Ht 5\' 3"  (1.6 m)  Wt 157 lb (71.215 kg)  BMI 27.82 kg/m2  SpO2 98%  HENT:  Head: Normocephalic and atraumatic.  Cardiovascular: Normal rate, regular rhythm and normal heart sounds.  Exam reveals no gallop and no friction rub.   No murmur heard. Pulmonary/Chest: Effort normal and breath sounds normal.  Abdominal: Soft. Normal appearance, normal aorta and bowel sounds are normal. She exhibits no distension and no mass. There is no hepatosplenomegaly, splenomegaly or hepatomegaly. There is tenderness (on deep palpation) in the suprapubic area and left lower quadrant. There is no rigidity, no rebound, no guarding and no CVA tenderness.    Neurological: She is alert.  Skin: Skin is warm, dry and intact. Rash (Neck and hairline) noted.     Psychiatric: She has a normal mood and affect. Her behavior is normal.  Vitals reviewed.  Results for orders placed or performed in visit on 08/20/15  POCT urinalysis dipstick  Result Value Ref Range   Color, UA yellow yellow   Clarity, UA clear clear   Glucose, UA negative negative   Bilirubin, UA negative negative   Ketones, POC UA negative negative   Spec Grav, UA >=1.030    Blood, UA moderate (A) negative   pH, UA 5.5    Protein Ur, POC negative negative   Urobilinogen, UA 0.2    Nitrite, UA Negative Negative   Leukocytes, UA Negative Negative  POCT Microscopic Urinalysis ([facility])  Result Value Ref Range   WBC,UR,HPF,POC None None WBC/hpf   RBC,UR,HPF,POC Few (A) None  RBC/hpf   Bacteria Few (A) None, Too numerous to count   Mucus Present (A) Absent   Epithelial Cells, UR Per Microscopy Moderate (A) None, Too numerous to count cells/hpf  POCT urine pregnancy  Result Value Ref Range   Preg Test, Ur Positive (A) Negative       Assessment & Plan:  1. Dysuria - POCT urinalysis dipstick - POCT Microscopic Urinalysis ([facility])  2. Pelvic pain in female - US OB Comp Less 14 Wks; Future - US OB  Transvaginal; Future - POCT urine pregnancy  3. Encounter for pregnancy test - hCG, quantitative, pregnancy - US OB Comp Less 14 Wks; Future - US OB Transvaginal; Future - POCT urine pregnancy  Return if symptoms worsen or fail to improve.  We forgot to address the additional complaint of a rash. The rash is located on her neck. She states last year she came in to be seen and was given hydrocortisone cream for a rash that covered her body. This is an area that remained and did not go away. She can come back in for further evaluation if needed.

## 2015-08-20 NOTE — Progress Notes (Signed)
Patient ID: Krystal OliphantLissette Dixon, female    DOB: 09/30/1985, 30 y.o.   MRN: [id number]  PCP: No primary care provider on file.  Subjective:   Chief Complaint  Patient presents with  . Other    Pregnancy Test    HPI Patient presents today after a positive home pregnancy test.   She complains of pain in the RLQ and LLQ. She experienced the pain after taking her pregnancy test at home last Thursday, 08/12/15. Last year she experienced a miscarriage, which is why she came in today. The pain is constant. She is unable to describe what the pain feels like. It is aggravated during urination. She notes no relieving factors. She rates the pain as a 4/10.   She states on 08/13/15 she began experiencing dysuria. Her urine is a very dark yellow color with a strong odor. She states she has noticed an increase in the frequency of her urination, about 6-8x/day. Before the onset she would only urinate 3-4x/day. Denies hematuria and sexual activity since onset.    Review of Systems Constitutional: Negative for fever, chills, appetite change and fatigue.  Respiratory: Negative for shortness of breath.  Cardiovascular: Negative for chest pain and palpitations.  Gastrointestinal: Negative for nausea, vomiting, diarrhea, constipation, blood in stool and abdominal distention.  Genitourinary: Positive for dysuria, frequency (6-7x/day) and pelvic pain (RLQ and LLQ). Negative for urgency, hematuria, flank pain, decreased urine volume, vaginal bleeding, vaginal discharge, difficulty urinating, genital sores and vaginal pain. Enuresis: normal.  Psychiatric/Behavioral: Negative for sleep disturbance.     Patient Active Problem List   Diagnosis Date Noted  . Rash and nonspecific skin eruption 04/14/2013     Prior to Admission medications   Not on File     No Known Allergies     Objective:  Physical Exam  Constitutional: She is oriented to person, place, and time. Vital signs are normal. She appears  well-developed and well-nourished. No distress.  HENT:  Head: Normocephalic and atraumatic.  Cardiovascular: Normal rate, regular rhythm and normal heart sounds.   Pulmonary/Chest: Effort normal and breath sounds normal.  Abdominal: Soft. Normal appearance and bowel sounds are normal. She exhibits no distension and no mass. There is no hepatosplenomegaly. There is tenderness in the suprapubic area and left lower quadrant. There is no rigidity, no rebound, no guarding, no CVA tenderness, no tenderness at McBurney's point and negative Murphy's sign. No hernia.  Musculoskeletal: Normal range of motion.       Lumbar back: Normal.  Neurological: She is alert and oriented to person, place, and time.  Skin: Skin is warm and dry. No rash noted. She is not diaphoretic. No pallor.  Psychiatric: She has a normal mood and affect. Her speech is normal and behavior is normal. Judgment normal.       Results for orders placed or performed in visit on 08/20/15  POCT urinalysis dipstick  Result Value Ref Range   Color, UA yellow yellow   Clarity, UA clear clear   Glucose, UA negative negative   Bilirubin, UA negative negative   Ketones, POC UA negative negative   Spec Grav, UA >=1.030    Blood, UA moderate (A) negative   pH, UA 5.5    Protein Ur, POC negative negative   Urobilinogen, UA 0.2    Nitrite, UA Negative Negative   Leukocytes, UA Negative Negative  POCT Microscopic Urinalysis ([facility])  Result Value Ref Range   WBC,UR,HPF,POC None None WBC/hpf   RBC,UR,HPF,POC Few (A) None RBC/hpf  Bacteria Few (A) None, Too numerous to count   Mucus Present (A) Absent   Epithelial Cells, UR Per Microscopy Moderate (A) None, Too numerous to count cells/hpf  POCT urine pregnancy  Result Value Ref Range   Preg Test, Ur Positive (A) Negative       Assessment & Plan:   1. Dysuria No evidence of UTI. Await UCx results. - POCT urinalysis dipstick - POCT Microscopic Urinalysis ([facility]) - Urine  culture  2. 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 To [facility] for [place]. - US OB Comp Less 14 Wks; Future - US OB Transvaginal; Future - POCT urine pregnancy  3. Encounter for pregnancy test As above. - hCG, quantitative, pregnancy - US OB Comp Less 14 Wks; Future - US OB Transvaginal; Future - POCT urine pregnancy   Fernande Bras, PA-C Physician Assistant-Certified [facility]

## 2015-08-22 LAB — URINE CULTURE

## 2015-08-29 ENCOUNTER — Ambulatory Visit (INDEPENDENT_AMBULATORY_CARE_PROVIDER_SITE_OTHER): Payer: BLUE CROSS/BLUE SHIELD | Admitting: Family Medicine

## 2015-08-29 VITALS — BP 116/68 | HR 60 | Temp 98.4°F | Resp 16 | Ht 62.75 in | Wt 159.0 lb

## 2015-08-29 DIAGNOSIS — M545 Low back pain, unspecified: Secondary | ICD-10-CM

## 2015-08-29 DIAGNOSIS — Z3490 Encounter for supervision of normal pregnancy, unspecified, unspecified trimester: Secondary | ICD-10-CM

## 2015-08-29 DIAGNOSIS — R319 Hematuria, unspecified: Secondary | ICD-10-CM | POA: Diagnosis not present

## 2015-08-29 DIAGNOSIS — R350 Frequency of micturition: Secondary | ICD-10-CM

## 2015-08-29 LAB — HCG, QUANTITATIVE, PREGNANCY: hCG, Beta Chain, Quant, S: 51613.9 m[IU]/mL — ABNORMAL HIGH

## 2015-08-29 LAB — POCT URINALYSIS DIP (MANUAL ENTRY)
Bilirubin, UA: NEGATIVE
Glucose, UA: NEGATIVE
Ketones, POC UA: NEGATIVE
Leukocytes, UA: NEGATIVE
NITRITE UA: NEGATIVE
PH UA: 5.5
Protein Ur, POC: NEGATIVE
RBC UA: NEGATIVE
Spec Grav, UA: 1.025
UROBILINOGEN UA: 0.2

## 2015-08-29 LAB — POC MICROSCOPIC URINALYSIS (UMFC)

## 2015-08-29 MED ORDER — CEPHALEXIN 500 MG PO CAPS: 500.0000 mg | ORAL_CAPSULE | Freq: Two times a day (BID) | ORAL | Status: DC

## 2015-08-29 NOTE — Progress Notes (Addendum)
Subjective:  This chart was scribed for [name], MD by [facility], medical scribe at [facility].The patient was seen in exam room 02 and the patient's care was started at 8:29 AM.   Patient ID: Krystal Dixon, female    DOB: 04/20/1986, 30 y.o.   MRN: [id number] Chief Complaint  Patient presents with  . Flank Pain    right side--painful with movement--5 days  . Labs    ws told to come back and recheck    HPI HPI Comments: Krystal Dixon is a 30 y.o. female who presents to [facility] for right sided flank pain which radiates down her right buttock. This has been ongoing for four days, she has had a similar pain one year ago. Initially had difficulty with walking but this has improved. She not taken any medications for this. No vaginal bleeding, hematuria, abdominal pain or abdominal camping. GTP 0/0/1/0, she is 10 weeks, 6 days by LMP in Nov but she is unsure if had a MP in Dec.  Also here for lab work. On January 13 th, known to have hematuria and a positive pregnancy test. Had a transvaginal US, suspected viable IUP. Reccommended follow up US in 7-10. Subchorionic hemorrhage. No other complications. Urine culture had 40,000 colonies of multiple types. Miscarriage in 2016. Taking a prenatal vitamin, no smoking, drinking or prescription medications.  Patient Active Problem List   Diagnosis Date Noted  . Rash and nonspecific skin eruption 04/14/2013   Past Medical History  Diagnosis Date  . Medical history non-contributory    Past Surgical History  Procedure Laterality Date  . No past surgeries     No Known Allergies Prior to Admission medications   Not on File   Social History   Social History  . Marital Status: Married    Spouse Name: N/A  . Number of Children: N/A  . Years of Education: N/A   Occupational History  . Not on file.   Social History Main Topics  . Smoking status: Never Smoker   . Smokeless  tobacco: Never Used  . Alcohol Use: No  . Drug Use: No  . Sexual Activity: Yes   Other Topics Concern  . Not on file   Social History Narrative   Review of Systems  Gastrointestinal: Negative for abdominal pain.  Genitourinary: Positive for flank pain. Negative for hematuria, vaginal bleeding, vaginal pain and pelvic pain.      Objective:  BP 116/68 mmHg  Pulse 60  Temp(Src) 98.4 F (36.9 C) (Oral)  Resp 16  Ht 5' 2.75" (1.594 m)  Wt 159 lb (72.122 kg)  BMI 28.39 kg/m2  SpO2 99%  LMP 06/14/2015 (Approximate) Physical Exam  Constitutional: She is oriented to person, place, and time. She appears well-developed and well-nourished. No distress.  HENT:  Head: Normocephalic and atraumatic.  Eyes: Pupils are equal, round, and reactive to light.  Neck: Normal range of motion.  Cardiovascular: Normal rate and regular rhythm.   Pulmonary/Chest: Effort normal. No respiratory distress.  Abdominal: Soft. There is no tenderness. There is no CVA tenderness.  Musculoskeletal: Normal range of motion.  Slightly tender over the right sciatic notch. Minimal pain with forward flexion. Minimal discomfort with right rotation.  Neurological: She is alert and oriented to person, place, and time.  Reflex Scores:      Patellar reflexes are 2+ on the right side and 2+ on the left side.      Achilles reflexes  are 2+ on the right side and 2+ on the left side. Skin: Skin is warm and dry.  Psychiatric: She has a normal mood and affect. Her behavior is normal.  Nursing note and vitals reviewed.  Results for orders placed or performed in visit on 08/29/15  POCT urinalysis dipstick  Result Value Ref Range   Color, UA yellow yellow   Clarity, UA clear clear   Glucose, UA negative negative   Bilirubin, UA negative negative   Ketones, POC UA negative negative   Spec Grav, UA 1.025    Blood, UA negative negative   pH, UA 5.5    Protein Ur, POC negative negative   Urobilinogen, UA 0.2    Nitrite,  UA Negative Negative   Leukocytes, UA Negative Negative  POCT Microscopic Urinalysis ([facility])  Result Value Ref Range   WBC,UR,HPF,POC Few (A) None WBC/hpf   RBC,UR,HPF,POC None None RBC/hpf   Bacteria Few (A) None, Too numerous to count   Mucus Present (A) Absent   Epithelial Cells, UR Per Microscopy Few (A) None, Too numerous to count cells/hpf   At end of visit, she does not state that she has had some burning with urination at times, increased urination. No fevers. No nausea vomiting.     Assessment & Plan:   Krystal Dixon is a 30 y.o. female Hematuria - Plan: POCT urinalysis dipstick, POCT Microscopic Urinalysis ([facility]), hCG, quantitative, pregnancy, Urine culture, cephALEXin (KEFLEX) 500 MG capsule Urinary frequency - Plan: cephALEXin (KEFLEX) 500 MG capsule  - As she is having dysuria, and increased frequency, will treat for possible UTI versus asymptomatic bacteriuria at this time with Keflex. Urine culture pending. RTC precautions.  Pregnancy - Plan: US OB Transvaginal, hCG, quantitative, pregnancy Right-sided low back pain without sciatica - Plan: hCG, quantitative, pregnancy  -Now improving, suspect mechanical low back pain versus mild sciatica. Tylenol over-the-counter heat or ice range of motion. RTC precautions. Denies any abdominal pain or vaginal bleeding, less likely miscarriage. Advised if abdominal pain, pelvic pain or vaginal bleeding, be seen here or [facility] at [facility].  - Given recommendations from previous ultrasound, will repeat transvaginal ultrasound as well as quantitative hCG today, then follow-up as planned with OB/GYN. Continue PNV daily.  Meds ordered this encounter  Medications  . cephALEXin (KEFLEX) 500 MG capsule    Sig: Take 1 capsule (500 mg total) by mouth 2 (two) times daily.    Dispense:  14 capsule    Refill:  0   Patient Instructions  I suspect your low back pain was muscle pain or strain. This does not appear to be coming from urine  or pregnancy. However I will check the blood test for progression of pregnancy as well as the ultrasound as it was recommended this be followed up in a week to 10 days. We will call you about this appointment. Continue follow-up with scheduling obstetrician visit, continue prenatal vitamins once per day. If abdominal pain, pelvic pain, vaginal bleeding, or any worsening symptoms, return here or other care provider for recheck.   Okay to use Tylenol if needed for low back pain, heat or ice if needed, but as it is improving, may not need any treatments at this time.  Urine tests today only had a few infection fighting cells, but with your symptoms, will go ahead and treat for possible infection at this time with antibiotic Keflex. Take this twice per day, and we will check a urine culture to help determine if infection. If the urine  culture is negative, we may able to stop the antibiotic. We will call you about these results., we'll call in an antibiotic for you. If you have abdominal pain, nausea, vomiting, worsening back pain, fevers, or worsening urinary symptoms, return for recheck sooner. Return to the clinic or go to the nearest emergency room if any of your symptoms worsen or new symptoms occur.   Back Pain, Adult Back pain is very common in adults.The cause of back pain is rarely dangerous and the pain often gets better over time.The cause of your back pain may not be known. Some common causes of back pain include:  Strain of the muscles or ligaments supporting the spine.  Wear and tear (degeneration) of the spinal disks.  Arthritis.  Direct injury to the back. For many people, back pain may return. Since back pain is rarely dangerous, most people can learn to manage this condition on their own. HOME CARE INSTRUCTIONS Watch your back pain for any changes. The following actions may help to lessen any discomfort you are feeling:  Remain active. It is stressful on your back to sit or stand  in one place for long periods of time. Do not sit, drive, or stand in one place for more than 30 minutes at a time. Take short walks on even surfaces as soon as you are able.Try to increase the length of time you walk each day.  Exercise regularly as directed by your health care provider. Exercise helps your back heal faster. It also helps avoid future injury by keeping your muscles strong and flexible.  Do not stay in bed.Resting more than 1-2 days can delay your recovery.  Pay attention to your body when you bend and lift. The most comfortable positions are those that put less stress on your recovering back. Always use proper lifting techniques, including:  Bending your knees.  Keeping the load close to your body.  Avoiding twisting.  Find a comfortable position to sleep. Use a firm mattress and lie on your side with your knees slightly bent. If you lie on your back, put a pillow under your knees.  Avoid feeling anxious or stressed.Stress increases muscle tension and can worsen back pain.It is important to recognize when you are anxious or stressed and learn ways to manage it, such as with exercise.  Take medicines only as directed by your health care provider. Over-the-counter medicines to reduce pain and inflammation are often the most helpful.Your health care provider may prescribe muscle relaxant drugs.These medicines help dull your pain so you can more quickly return to your normal activities and healthy exercise.  Apply ice to the injured area:  Put ice in a plastic bag.  Place a towel between your skin and the bag.  Leave the ice on for 20 minutes, 2-3 times a day for the first 2-3 days. After that, ice and heat may be alternated to reduce pain and spasms.  Maintain a healthy weight. Excess weight puts extra stress on your back and makes it difficult to maintain good posture. SEEK MEDICAL CARE IF:  You have pain that is not relieved with rest or medicine.  You have  increasing pain going down into the legs or buttocks.  You have pain that does not improve in one week.  You have night pain.  You lose weight.  You have a fever or chills. SEEK IMMEDIATE MEDICAL CARE IF:   You develop new bowel or bladder control problems.  You have unusual weakness or numbness in  your arms or legs.  You develop nausea or vomiting.  You develop abdominal pain.  You feel faint.   This information is not intended to replace advice given to you by your health care provider. Make sure you discuss any questions you have with your health care provider.   Document Released: 07/24/2005 Document Revised: 08/14/2014 Document Reviewed: 11/25/2013 Elsevier Interactive Patient Education [employer]        By signing my name below, I, Nadim Abuhashem, attest that this documentation has been prepared under the direction and in the presence of [name], MD.  Electronically Signed: Conchita Paris, medical scribe. 08/29/2015, 8:35 AM.

## 2015-08-29 NOTE — Patient Instructions (Addendum)
I suspect your low back pain was muscle pain or strain. This does not appear to be coming from urine or pregnancy. However I will check the blood test for progression of pregnancy as well as the ultrasound as it was recommended this be followed up in a week to 10 days. We will call you about this appointment. Continue follow-up with scheduling obstetrician visit, continue prenatal vitamins once per day. If abdominal pain, pelvic pain, vaginal bleeding, or any worsening symptoms, return here or other care provider for recheck.   Okay to use Tylenol if needed for low back pain, heat or ice if needed, but as it is improving, may not need any treatments at this time.  Urine tests today only had a few infection fighting cells, but with your symptoms, will go ahead and treat for possible infection at this time with antibiotic Keflex. Take this twice per day, and we will check a urine culture to help determine if infection. If the urine culture is negative, we may able to stop the antibiotic. We will call you about these results., we'll call in an antibiotic for you. If you have abdominal pain, nausea, vomiting, worsening back pain, fevers, or worsening urinary symptoms, return for recheck sooner. Return to the clinic or go to the nearest emergency room if any of your symptoms worsen or new symptoms occur.   Back Pain, Adult Back pain is very common in adults.The cause of back pain is rarely dangerous and the pain often gets better over time.The cause of your back pain may not be known. Some common causes of back pain include:  Strain of the muscles or ligaments supporting the spine.  Wear and tear (degeneration) of the spinal disks.  Arthritis.  Direct injury to the back. For many people, back pain may return. Since back pain is rarely dangerous, most people can learn to manage this condition on their own. HOME CARE INSTRUCTIONS Watch your back pain for any changes. The following actions may help to  lessen any discomfort you are feeling:  Remain active. It is stressful on your back to sit or stand in one place for long periods of time. Do not sit, drive, or stand in one place for more than 30 minutes at a time. Take short walks on even surfaces as soon as you are able.Try to increase the length of time you walk each day.  Exercise regularly as directed by your health care provider. Exercise helps your back heal faster. It also helps avoid future injury by keeping your muscles strong and flexible.  Do not stay in bed.Resting more than 1-2 days can delay your recovery.  Pay attention to your body when you bend and lift. The most comfortable positions are those that put less stress on your recovering back. Always use proper lifting techniques, including:  Bending your knees.  Keeping the load close to your body.  Avoiding twisting.  Find a comfortable position to sleep. Use a firm mattress and lie on your side with your knees slightly bent. If you lie on your back, put a pillow under your knees.  Avoid feeling anxious or stressed.Stress increases muscle tension and can worsen back pain.It is important to recognize when you are anxious or stressed and learn ways to manage it, such as with exercise.  Take medicines only as directed by your health care provider. Over-the-counter medicines to reduce pain and inflammation are often the most helpful.Your health care provider may prescribe muscle relaxant drugs.These medicines help dull  your pain so you can more quickly return to your normal activities and healthy exercise.  Apply ice to the injured area:  Put ice in a plastic bag.  Place a towel between your skin and the bag.  Leave the ice on for 20 minutes, 2-3 times a day for the first 2-3 days. After that, ice and heat may be alternated to reduce pain and spasms.  Maintain a healthy weight. Excess weight puts extra stress on your back and makes it difficult to maintain good  posture. SEEK MEDICAL CARE IF:  You have pain that is not relieved with rest or medicine.  You have increasing pain going down into the legs or buttocks.  You have pain that does not improve in one week.  You have night pain.  You lose weight.  You have a fever or chills. SEEK IMMEDIATE MEDICAL CARE IF:   You develop new bowel or bladder control problems.  You have unusual weakness or numbness in your arms or legs.  You develop nausea or vomiting.  You develop abdominal pain.  You feel faint.   This information is not intended to replace advice given to you by your health care provider. Make sure you discuss any questions you have with your health care provider.   Document Released: 07/24/2005 Document Revised: 08/14/2014 Document Reviewed: 11/25/2013 Elsevier Interactive Patient Education [employer]

## 2015-08-30 LAB — URINE CULTURE
COLONY COUNT: NO GROWTH
ORGANISM ID, BACTERIA: NO GROWTH

## 2015-09-06 ENCOUNTER — Ambulatory Visit (HOSPITAL_COMMUNITY): Payer: BLUE CROSS/BLUE SHIELD

## 2015-09-09 ENCOUNTER — Other Ambulatory Visit (INDEPENDENT_AMBULATORY_CARE_PROVIDER_SITE_OTHER): Payer: Self-pay

## 2015-09-09 DIAGNOSIS — Z3481 Encounter for supervision of other normal pregnancy, first trimester: Secondary | ICD-10-CM

## 2015-09-09 NOTE — Progress Notes (Signed)
New ob labs done today Krystal Dixon 

## 2015-09-10 LAB — OBSTETRIC PANEL
ANTIBODY SCREEN: NEGATIVE
BASOS ABS: 0 10*3/uL (ref 0.0–0.1)
Basophils Relative: 0 % (ref 0–1)
EOS ABS: 0 10*3/uL (ref 0.0–0.7)
EOS PCT: 0 % (ref 0–5)
HCT: 40 % (ref 36.0–46.0)
HEMOGLOBIN: 13.3 g/dL (ref 12.0–15.0)
Hepatitis B Surface Ag: NEGATIVE
LYMPHS PCT: 23 % (ref 12–46)
Lymphs Abs: 3 10*3/uL (ref 0.7–4.0)
MCH: 26.7 pg (ref 26.0–34.0)
MCHC: 33.3 g/dL (ref 30.0–36.0)
MCV: 80.3 fL (ref 78.0–100.0)
MONO ABS: 1 10*3/uL (ref 0.1–1.0)
MPV: 8.8 fL (ref 8.6–12.4)
Monocytes Relative: 8 % (ref 3–12)
Neutro Abs: 8.9 10*3/uL — ABNORMAL HIGH (ref 1.7–7.7)
Neutrophils Relative %: 69 % (ref 43–77)
PLATELETS: 335 10*3/uL (ref 150–400)
RBC: 4.98 MIL/uL (ref 3.87–5.11)
RDW: 13.5 % (ref 11.5–15.5)
RH TYPE: POSITIVE
RUBELLA: 5.45 {index} — AB (ref <0.90)
WBC: 12.9 10*3/uL — AB (ref 4.0–10.5)

## 2015-09-10 LAB — CULTURE, OB URINE: Colony Count: 2000

## 2015-09-10 LAB — HIV ANTIBODY (ROUTINE TESTING W REFLEX): HIV: NONREACTIVE

## 2015-09-10 LAB — SICKLE CELL SCREEN: Sickle Cell Screen: NEGATIVE

## 2015-09-16 ENCOUNTER — Ambulatory Visit (INDEPENDENT_AMBULATORY_CARE_PROVIDER_SITE_OTHER): Payer: BLUE CROSS/BLUE SHIELD | Admitting: Internal Medicine

## 2015-09-16 ENCOUNTER — Encounter: Payer: Self-pay | Admitting: Internal Medicine

## 2015-09-16 VITALS — BP 106/60 | HR 68 | Temp 97.7°F | Wt 155.6 lb

## 2015-09-16 DIAGNOSIS — Z3491 Encounter for supervision of normal pregnancy, unspecified, first trimester: Secondary | ICD-10-CM

## 2015-09-16 DIAGNOSIS — Z3401 Encounter for supervision of normal first pregnancy, first trimester: Secondary | ICD-10-CM

## 2015-09-16 NOTE — Patient Instructions (Signed)
Primer trimestre de embarazo  (First Trimester of Pregnancy)  El primer trimestre de embarazo se extiende desde la semana 1 hasta el final de la semana 12 (mes 1 al mes 3). Una semana después de que un espermatozoide fecunda un óvulo, este se implantará en la pared uterina. Este embrión comenzará a desarrollarse hasta convertirse en un bebé. Sus genes y los de su pareja forman el bebé. Los genes del varón determinan si será un niño o una niña. Entre la semana 6 y la 8, se forman los ojos y el rostro, y los latidos del corazón pueden verse en la ecografía. Al final de las 12 semanas, todos los órganos del bebé están formados.   Ahora que está embarazada, querrá hacer todo lo que esté a su alcance para tener un bebé sano. Dos de las cosas más importantes son tener una buena atención prenatal y seguir las indicaciones del médico. La atención prenatal incluye toda la asistencia médica que usted recibe antes del nacimiento del bebé. Esta ayudará a prevenir, detectar y tratar cualquier problema durante el embarazo y el parto.  CAMBIOS EN EL ORGANISMO  Su organismo atraviesa por muchos cambios durante el embarazo, y estos varían de una mujer a otra.   · Al principio, puede aumentar o bajar algunos kilos.  · Puede tener malestar estomacal (náuseas) y vomitar. Si no puede controlar los vómitos, llame al médico.  · Puede cansarse con facilidad.  · Es posible que tenga dolores de cabeza que pueden aliviarse con los medicamentos que el médico le permita tomar.  · Puede orinar con mayor frecuencia. El dolor al orinar puede significar que usted tiene una infección de la vejiga.  · Debido al embarazo, puede tener acidez estomacal.  · Puede estar estreñida, ya que ciertas hormonas enlentecen los movimientos de los músculos que empujan los desechos a través de los intestinos.  · Pueden aparecer hemorroides o abultarse e hincharse las venas (venas varicosas).  · Las mamas pueden empezar a agrandarse y estar sensibles. Los pezones  pueden sobresalir más, y el tejido que los rodea (areola) tornarse más oscuro.  · Las encías pueden sangrar y estar sensibles al cepillado y al hilo dental.  · Pueden aparecer zonas oscuras o manchas (cloasma, máscara del embarazo) en el rostro que probablemente se atenuarán después del nacimiento del bebé.  · Los períodos menstruales se interrumpirán.  · Tal vez no tenga apetito.  · Puede sentir un fuerte deseo de consumir ciertos alimentos.  · Puede tener cambios a nivel emocional día a día, por ejemplo, por momentos puede estar emocionada por el embarazo y por otros preocuparse porque algo pueda salir mal con el embarazo o el bebé.  · Tendrá sueños más vívidos y extraños.  · Tal vez haya cambios en el cabello que pueden incluir su engrosamiento, crecimiento rápido y cambios en la textura. A algunas mujeres también se les cae el cabello durante o después del embarazo, o tienen el cabello seco o fino. Lo más probable es que el cabello se le normalice después del nacimiento del bebé.  QUÉ DEBE ESPERAR EN LAS CONSULTAS PRENATALES  Durante una visita prenatal de rutina:  · La pesarán para asegurarse de que usted y el bebé están creciendo normalmente.  · Le controlarán la presión arterial.  · Le medirán el abdomen para controlar el desarrollo del bebé.  · Se escucharán los latidos cardíacos a partir de la semana 10 o la 12 de embarazo, aproximadamente.  · Se analizarán los resultados de los estudios solicitados en visitas anteriores.  El   médico puede preguntarle:  · Cómo se siente.  · Si siente los movimientos del bebé.  · Si ha tenido síntomas anormales, como pérdida de líquido, sangrado, dolores de cabeza intensos o cólicos abdominales.  · Si está consumiendo algún producto que contenga tabaco, como cigarrillos, tabaco de mascar y cigarrillos electrónicos.  · Si tiene alguna pregunta.  Otros estudios que pueden realizarse durante el primer trimestre incluyen lo siguiente:  · Análisis de sangre para determinar el tipo  de sangre y detectar la presencia de infecciones previas. Además, se los usará para controlar si los niveles de hierro son bajos (anemia) y determinar los anticuerpos Rh. En una etapa más avanzada del embarazo, se harán análisis de sangre para saber si tiene diabetes, junto con otros estudios si surgen problemas.  · Análisis de orina para detectar infecciones, diabetes o proteínas en la orina.  · Una ecografía para confirmar que el bebé crece y se desarrolla correctamente.  · Una amniocentesis para diagnosticar posibles problemas genéticos.  · Estudios del feto para descartar espina bífida y síndrome de Down.  · Es posible que necesite otras pruebas adicionales.  · Prueba del VIH (virus de inmunodeficiencia humana). Los exámenes prenatales de rutina incluyen la prueba de detección del VIH, a menos que decida no realizársela.  INSTRUCCIONES PARA EL CUIDADO EN EL HOGAR   Medicamentos:  · Siga las indicaciones del médico en relación con el uso de medicamentos. Durante el embarazo, hay medicamentos que pueden tomarse y otros que no.  · Tome las vitaminas prenatales como se le indicó.  · Si está estreñida, tome un laxante suave, si el médico lo autoriza.  Dieta  · Consuma alimentos balanceados. Elija alimentos variados, como carne o proteínas de origen vegetal, pescado, leche y productos lácteos descremados, verduras, frutas y panes y cereales integrales. El médico la ayudará a determinar la cantidad de peso que puede aumentar.  · No coma carne cruda ni quesos sin cocinar. Estos elementos contienen bacterias que pueden causar defectos congénitos en el bebé.  · La ingesta diaria de cuatro o cinco comidas pequeñas en lugar de tres comidas abundantes puede ayudar a aliviar las náuseas y los vómitos. Si empieza a tener náuseas, comer algunas galletas saladas puede ser de ayuda. Beber líquidos entre las comidas en lugar de tomarlos durante las comidas también puede ayudar a calmar las náuseas y los vómitos.  · Si está  estreñida, consuma alimentos con alto contenido de fibra, como verduras y frutas frescas, y cereales integrales. Beba suficiente líquido para mantener la orina clara o de color amarillo pálido.  Actividad y ejercicios  · Haga ejercicio solamente como se lo haya indicado el médico. El ejercicio la ayudará a:    Controlar el peso.    Mantenerse en forma.    Estar preparada para el trabajo de parto y el parto.  · Los dolores, los cólicos en la parte baja del abdomen o los calambres en la cintura son un buen indicio de que debe dejar de hacer ejercicios. Consulte al médico antes de seguir haciendo ejercicios normales.  · Intente no estar de pie durante mucho tiempo. Mueva las piernas con frecuencia si debe estar de pie en un lugar durante mucho tiempo.  · Evite levantar pesos excesivos.  · Use zapatos de tacones bajos y mantenga una buena postura.  · Puede seguir teniendo relaciones sexuales, excepto que el médico le indique lo contrario.  Alivio del dolor o las molestias  · Use un sostén que le brinde buen   soporte si siente dolor a la palpación en las mamas.  · Dese baños de asiento con agua tibia para aliviar el dolor o las molestias causadas por las hemorroides. Use crema antihemorroidal si el médico se lo permite.  · Descanse con las piernas elevadas si tiene calambres o dolor de cintura.  · Si tiene venas varicosas en las piernas, use medias de descanso. Eleve los pies durante 15 minutos, 3 o 4 veces por día. Limite la cantidad de sal en su dieta.  Cuidados prenatales  · Programe las visitas prenatales para la semana 12 de embarazo. Generalmente se programan cada mes al principio y se hacen más frecuentes en los 2 últimos meses antes del parto.  · Escriba sus preguntas. Llévelas cuando concurra a las visitas prenatales.  · Concurra a todas las visitas prenatales como se lo haya indicado el médico.  Seguridad  · Colóquese el cinturón de seguridad cuando conduzca.  · Haga una lista de los números de teléfono de  emergencia, que incluya los números de teléfono de familiares, amigos, el hospital y los departamentos de policía y bomberos.  Consejos generales  · Pídale al médico que la derive a clases de educación prenatal en su localidad. Debe comenzar a tomar las clases antes de entrar en el mes 6 de embarazo.  · Pida ayuda si tiene necesidades nutricionales o de asesoramiento durante el embarazo. El médico puede aconsejarla o derivarla a especialistas para que la ayuden con diferentes necesidades.  · No se dé baños de inmersión en agua caliente, baños turcos ni saunas.  · No se haga duchas vaginales ni use tampones o toallas higiénicas perfumadas.  · No mantenga las piernas cruzadas durante mucho tiempo.  · Evite el contacto con las bandejas sanitarias de los gatos y la tierra que estos animales usan. Estos elementos contienen bacterias que pueden causar defectos congénitos al bebé y la posible pérdida del feto debido a un aborto espontáneo o muerte fetal.  · No fume, no consuma hierbas ni medicamentos que no hayan sido recetados por el médico. Las sustancias químicas que estos productos contienen afectan la formación y el desarrollo del bebé.  · No consuma ningún producto que contenga tabaco, lo que incluye cigarrillos, tabaco de mascar y cigarrillos electrónicos. Si necesita ayuda para dejar de fumar, consulte al médico. Puede recibir asesoramiento y otro tipo de recursos para dejar de fumar.  · Programe una cita con el dentista. En su casa, lávese los dientes con un cepillo dental blando y pásese el hilo dental con suavidad.  SOLICITE ATENCIÓN MÉDICA SI:   · Tiene mareos.  · Siente cólicos leves, presión en la pelvis o dolor persistente en el abdomen.  · Tiene náuseas, vómitos o diarrea persistentes.  · Tiene secreción vaginal con mal olor.  · Siente dolor al orinar.  · Tiene el rostro, las manos, las piernas o los tobillos más hinchados.  SOLICITE ATENCIÓN MÉDICA DE INMEDIATO SI:   · Tiene fiebre.  · Tiene una pérdida de  líquido por la vagina.  · Tiene sangrado o pequeñas pérdidas vaginales.  · Siente dolor intenso o cólicos en el abdomen.  · Sube o baja de peso rápidamente.  · Vomita sangre de color rojo brillante o material que parezca granos de café.  · Ha estado expuesta a la rubéola y no ha sufrido la enfermedad.  · Ha estado expuesta a la quinta enfermedad o a la varicela.  · Tiene un dolor de cabeza intenso.  · Le falta el aire.  · Sufre   cualquier tipo de traumatismo, por ejemplo, debido a una caída o un accidente automovilístico.     Esta información no tiene como fin reemplazar el consejo del médico. Asegúrese de hacerle al médico cualquier pregunta que tenga.     Document Released: 05/03/2005 Document Revised: 08/14/2014  Elsevier Interactive Patient Education ©2016 Elsevier Inc.

## 2015-09-16 NOTE — Progress Notes (Signed)
Krystal Dixon is a 30 y.o. yo G2P0010 at [redacted]w[redacted]d who presents for her initial prenatal visit. Pregnancy is planned She reports breast tenderness, fatigue and positive home pregnancy test. She  is taking PNV. See flow sheet for details.  PMH, POBH, FH, meds, allergies and Social Hx reviewed.  Prenatal Exam: Gen: Well nourished, well developed.  No distress.  Vitals noted. HEENT: Normocephalic, atraumatic.  Neck supple without cervical lymphadenopathy, thyromegaly or thyroid nodules.   CV: RRR no murmur, gallops or rubs Lungs: CTAB.  Normal respiratory effort without wheezes or rales. Abd: soft, NTND. +BS.  Uterus not appreciated above pelvis. Ext: No clubbing, cyanosis or edema. Psych: Normal grooming and dress.  Not depressed or anxious appearing.  Normal thought content and process without flight of ideas or looseness of associations.  Assessment & Plan: 1) 30 y.o. yo G2P0010 at [redacted]w[redacted]d via LMP doing well.  Current pregnancy issues include lower back pain. She was seen on January 22 at [facility] for lower back pain. Urine culture was performed and was negative for infection A ultrasound was performed and  Noted a normal pregnancy a small subchorionic hemorrhage with recommendations for repeat imaging in 7-10 days. And had not done imaging yet. Discussed with patient and she indicated that she would go to the [facility] hospital to get imaging. Denied any vaginal bleeding.  Dating is reliable. Prenatal labs reviewed, next visit patient will need a Pap smear and GC probe Genetic screening offered: patient wanted to think about it.  Early glucola is not indicated.  Bleeding and pain precautions reviewed. Importance of prenatal vitamins reviewed.  Follow up in 4 weeks.

## 2015-09-17 ENCOUNTER — Telehealth: Payer: Self-pay | Admitting: Internal Medicine

## 2015-09-17 NOTE — Telephone Encounter (Signed)
Patients asks PCP to fax [facility] Form [phone]) in order to get a dentist appointment ASAP. Please, follow up with Patient (Spanish)

## 2015-09-20 ENCOUNTER — Ambulatory Visit (HOSPITAL_COMMUNITY)
Admission: RE | Admit: 2015-09-20 | Discharge: 2015-09-20 | Disposition: A | Discharge status: Home / Self Care | Payer: BLUE CROSS/BLUE SHIELD | Source: Ambulatory Visit | Attending: Family Medicine | Admitting: Family Medicine

## 2015-09-20 DIAGNOSIS — Z3490 Encounter for supervision of normal pregnancy, unspecified, unspecified trimester: Secondary | ICD-10-CM

## 2015-09-20 DIAGNOSIS — Z36 Encounter for antenatal screening of mother: Secondary | ICD-10-CM | POA: Insufficient documentation

## 2015-09-20 DIAGNOSIS — Z3A10 10 weeks gestation of pregnancy: Secondary | ICD-10-CM | POA: Diagnosis not present

## 2015-09-23 NOTE — Telephone Encounter (Signed)
Unsure what WIC form for patient is referring to, patient with [employer] so should be able to call any dentist and make appointment. Left message on voicmail informing of this.

## 2015-09-24 ENCOUNTER — Telehealth: Payer: Self-pay | Admitting: Internal Medicine

## 2015-09-24 NOTE — Telephone Encounter (Signed)
Patient asks about the results of last week ultrasound [facility]). Also, Patient asks PCP to sign [facility] dentist referral because she does not have dental insurance. Please, follow up (Spanish).

## 2015-09-29 ENCOUNTER — Telehealth: Payer: Self-pay | Admitting: Internal Medicine

## 2015-09-29 NOTE — Telephone Encounter (Signed)
Patient asks again about ultrasound results [facility] on February 13). Also, Patient asks about [facility] Form for dentist referral. Please, follow up (Spanish).

## 2015-09-30 ENCOUNTER — Encounter: Payer: Self-pay | Admitting: Family Medicine

## 2015-09-30 ENCOUNTER — Ambulatory Visit (INDEPENDENT_AMBULATORY_CARE_PROVIDER_SITE_OTHER): Payer: Self-pay | Admitting: Family Medicine

## 2015-09-30 ENCOUNTER — Encounter: Payer: Self-pay | Admitting: Internal Medicine

## 2015-09-30 VITALS — BP 104/62 | HR 72 | Temp 98.2°F | Wt 156.6 lb

## 2015-09-30 DIAGNOSIS — O219 Vomiting of pregnancy, unspecified: Secondary | ICD-10-CM | POA: Insufficient documentation

## 2015-09-30 DIAGNOSIS — Z3400 Encounter for supervision of normal first pregnancy, unspecified trimester: Secondary | ICD-10-CM | POA: Insufficient documentation

## 2015-09-30 DIAGNOSIS — Z3401 Encounter for supervision of normal first pregnancy, first trimester: Secondary | ICD-10-CM

## 2015-09-30 MED ORDER — VITAMIN B-6 25 MG PO TABS: 25.0000 mg | ORAL_TABLET | Freq: Four times a day (QID) | ORAL | Status: DC | PRN

## 2015-09-30 MED ORDER — ONDANSETRON 4 MG PO TBDP: 4.0000 mg | ORAL_TABLET | Freq: Three times a day (TID) | ORAL | Status: DC | PRN

## 2015-09-30 NOTE — Telephone Encounter (Signed)
I have called the patient twice to try to discuss results of her ultrasound. Will send the results in the mail as well.

## 2015-09-30 NOTE — Patient Instructions (Signed)
It was great seeing you today. Your nausea and vomiting could be related to your pregnancy or a viral illness. I recommend the following.     Eats small frequent meals throughout the day.  Try not to go greater than 4 hours without eating.    be sure to drink plenty of water, as dehydration is a common cause of nausea during pregnancy   Take B6 (Pyridoxine) 25 mg every 6 hours as needed for nausea and vomiting.    Iin addition, you can take Zofran 4 mg every 6 hours as needed for vomiting  Next Appointment  Please call to make an appointment if your symptoms are not improving after 2-3 days or if you develop worsening vomiting, unable to keep down any liquids and begin to get dehydrated.   If you have any questions or concerns before then, please call the clinic at [phone].  Take Care,   Dr Wenda Low  Hyperemesis Gravidarum Hyperemesis gravidarum is a severe form of nausea and vomiting that happens during pregnancy. Hyperemesis is worse than morning sickness. It may cause you to have nausea or vomiting all day for many days. It may keep you from eating and drinking enough food and liquids. Hyperemesis usually occurs during the first half (the first 20 weeks) of pregnancy. It often goes away once a woman is in her second half of pregnancy. However, sometimes hyperemesis continues through an entire pregnancy.  CAUSES  The cause of this condition is not completely known but is thought to be related to changes in the body's hormones when pregnant. It could be from the high level of the pregnancy hormone or an increase in estrogen in the body.  SIGNS AND SYMPTOMS   Severe nausea and vomiting.  Nausea that does not go away.  Vomiting that does not allow you to keep any food down.  Weight loss and body fluid loss (dehydration).  Having no desire to eat or not liking food you have previously enjoyed. DIAGNOSIS  Your health care provider will do a physical exam and ask you about  your symptoms. He or she may also order blood tests and urine tests to make sure something else is not causing the problem.  TREATMENT  You may only need medicine to control the problem. If medicines do not control the nausea and vomiting, you will be treated in the hospital to prevent dehydration, increased acid in the blood (acidosis), weight loss, and changes in the electrolytes in your body that may harm the unborn baby (fetus). You may need IV fluids.  HOME CARE INSTRUCTIONS   Only take over-the-counter or prescription medicines as directed by your health care provider.  Try eating a couple of dry crackers or toast in the morning before getting out of bed.  Avoid foods and smells that upset your stomach.  Avoid fatty and spicy foods.  Eat 5-6 small meals a day.  Do not drink when eating meals. Drink between meals.  For snacks, eat high-protein foods, such as cheese.  Eat or suck on things that have ginger in them. Ginger helps nausea.  Avoid food preparation. The smell of food can spoil your appetite.  Avoid iron pills and iron in your multivitamins until after 3-4 months of being pregnant. However, consult with your health care provider before stopping any prescribed iron pills. SEEK MEDICAL CARE IF:   Your abdominal pain increases.  You have a severe headache.  You have vision problems.  You are losing weight. SEEK  IMMEDIATE MEDICAL CARE IF:   You are unable to keep fluids down.  You vomit blood.  You have constant nausea and vomiting.  You have excessive weakness.  You have extreme thirst.  You have dizziness or fainting.  You have a fever or persistent symptoms for more than 2-3 days.  You have a fever and your symptoms suddenly get worse. MAKE SURE YOU:   Understand these instructions.  Will watch your condition.  Will get help right away if you are not doing well or get worse.   This information is not intended to replace advice given to you by  your health care provider. Make sure you discuss any questions you have with your health care provider.   Document Released: 07/24/2005 Document Revised: 05/14/2013 Document Reviewed: 03/05/2013 Elsevier Interactive Patient Education [employer]

## 2015-09-30 NOTE — Progress Notes (Signed)
   Subjective:    Patient ID: Krystal Dixon, female    DOB: September 05, 1985, 30 y.o.   MRN: [id number]  Seen for Same day visit for   CC:  Nausea and vomiting  She reports nausea and vomiting starting 2 days ago.  She has had difficulty eating, but continues to drink some liquids.  She denies any fevers, chills, rashes, diarrhea, blood in her stool.  She denies any loss of fluid or vaginal bleeding.  Denies any abdominal pain.  She has not been taking anything for nausea, vomiting.  Has not noticed anything makes nausea, vomiting, worse.  She denies nausea or vomiting during the first 10 weeks of her pregnancy.  She denies any sick contacts.   Smoking history noted  Objective:  BP 104/62 mmHg  Pulse 72  Temp(Src) 98.2 F (36.8 C) (Oral)  Wt 156 lb 9.6 oz (71.033 kg)  LMP 06/14/2015 (Approximate)  General: NAD Cardiac: RRR, normal heart sounds, no murmurs. Respiratory: CTAB, normal effort Abdomen: soft, nontender, nondistended, Bowel sounds present Extremities: no edema or cyanosis. WWP. Skin: warm and dry, no rashes noted    Assessment & Plan:   Nausea and vomiting during pregnancy prior to [redacted] weeks gestation  Nausea and vomiting for 2 days without prior nausea, vomiting during early pregnancy.  Possibly related to pregnancy versus gastroenteritis.  She has had ultrasounds that showed a small subchorionic hemorrhage but otherwise normal intrauterine pregnancy.  -  Discussed nausea, vomiting related to pregnancy.  Recommended small frequent meals, maintaining good hydration, Ginger -  Start pyridoxine 25 mg every 6 hours when necessary -  Prescribed Zofran 4 mg every 6 hours when necessary -  Advise return precautions if nausea, vomiting worsens that she is unable to keep down liquids in getting dehydrated -  Advise return precautions if she develops any abdominal pain, fevers or vaginal bleeding

## 2015-09-30 NOTE — Assessment & Plan Note (Signed)
Nausea and vomiting for 2 days without prior nausea, vomiting during early pregnancy.  Possibly related to pregnancy versus gastroenteritis.  She has had ultrasounds that showed a small subchorionic hemorrhage but otherwise normal intrauterine pregnancy.  -  Discussed nausea, vomiting related to pregnancy.  Recommended small frequent meals, maintaining good hydration, Ginger -  Start pyridoxine 25 mg every 6 hours when necessary -  Prescribed Zofran 4 mg every 6 hours when necessary -  Advise return precautions if nausea, vomiting worsens that she is unable to keep down liquids in getting dehydrated -  Advise return precautions if she develops any abdominal pain, fevers or vaginal bleeding

## 2015-10-14 ENCOUNTER — Encounter: Payer: Self-pay | Admitting: Internal Medicine

## 2015-10-14 ENCOUNTER — Ambulatory Visit (INDEPENDENT_AMBULATORY_CARE_PROVIDER_SITE_OTHER): Payer: BLUE CROSS/BLUE SHIELD | Admitting: Internal Medicine

## 2015-10-14 ENCOUNTER — Other Ambulatory Visit (HOSPITAL_COMMUNITY)
Admission: RE | Admit: 2015-10-14 | Discharge: 2015-10-14 | Disposition: A | Discharge status: Home / Self Care | Payer: BLUE CROSS/BLUE SHIELD | Source: Ambulatory Visit | Attending: Family Medicine | Admitting: Family Medicine

## 2015-10-14 VITALS — BP 124/69 | HR 81 | Temp 97.8°F | Wt 157.7 lb

## 2015-10-14 DIAGNOSIS — Z01419 Encounter for gynecological examination (general) (routine) without abnormal findings: Secondary | ICD-10-CM | POA: Diagnosis not present

## 2015-10-14 DIAGNOSIS — Z113 Encounter for screening for infections with a predominantly sexual mode of transmission: Secondary | ICD-10-CM | POA: Diagnosis present

## 2015-10-14 DIAGNOSIS — Z124 Encounter for screening for malignant neoplasm of cervix: Secondary | ICD-10-CM

## 2015-10-14 LAB — CBC
HCT: 34.6 % — ABNORMAL LOW (ref 36.0–46.0)
Hemoglobin: 12.1 g/dL (ref 12.0–15.0)
MCH: 27.9 pg (ref 26.0–34.0)
MCHC: 35 g/dL (ref 30.0–36.0)
MCV: 79.7 fL (ref 78.0–100.0)
MPV: 8.6 fL (ref 8.6–12.4)
PLATELETS: 232 10*3/uL (ref 150–400)
RBC: 4.34 MIL/uL (ref 3.87–5.11)
RDW: 13.8 % (ref 11.5–15.5)
WBC: 11.2 10*3/uL — ABNORMAL HIGH (ref 4.0–10.5)

## 2015-10-14 NOTE — Progress Notes (Deleted)
Rhiana Ottis StainMartinezluna is a 30 y.o. G1P0 at 4718w3d for routine follow up.  She reports headache and nausea.  See flow sheet for details.  Abdomen: Gravida, soft, non-tender  Pelvic exam: gravida uterus, normal cervix, slight white discharge from cervix, normal vaginal vault, no tenderness on palpation, external vulva without any abnormalities.  FHR:144 via ultrasound, could not obtain via doppler   A/P: Pregnancy at 1418w3d. Doing well.   Pregnancy issues include nausea, patient was given B-6 and  Zofran for break through vomiting.  Anatomy ultrasound ordered to be scheduled at 18-19 weeks. Pt  is not interested in genetic screening. Bleeding and pain precautions reviewed. Follow up 4 weeks.

## 2015-10-14 NOTE — Progress Notes (Deleted)
Krystal Dixon is a 30 y.o. G***P*** at 1343w3d for routine follow up.  She reports headache and vomiting See flow sheet for details.  A/P: Pregnancy at 8443w3d.  Doing well.   Pregnancy issues include*** Anatomy ultrasound ordered to be scheduled at 18-19 weeks. Pt  {Is/is not:9024} interested in genetic screening. Bleeding and pain precautions reviewed. Follow up 4 weeks.

## 2015-10-14 NOTE — Patient Instructions (Signed)
Primer trimestre de embarazo  (First Trimester of Pregnancy)  El primer trimestre de embarazo se extiende desde la semana 1 hasta el final de la semana 12 (mes 1 al mes 3). Una semana después de que un espermatozoide fecunda un óvulo, este se implantará en la pared uterina. Este embrión comenzará a desarrollarse hasta convertirse en un bebé. Sus genes y los de su pareja forman el bebé. Los genes del varón determinan si será un niño o una niña. Entre la semana 6 y la 8, se forman los ojos y el rostro, y los latidos del corazón pueden verse en la ecografía. Al final de las 12 semanas, todos los órganos del bebé están formados.   Ahora que está embarazada, querrá hacer todo lo que esté a su alcance para tener un bebé sano. Dos de las cosas más importantes son tener una buena atención prenatal y seguir las indicaciones del médico. La atención prenatal incluye toda la asistencia médica que usted recibe antes del nacimiento del bebé. Esta ayudará a prevenir, detectar y tratar cualquier problema durante el embarazo y el parto.  CAMBIOS EN EL ORGANISMO  Su organismo atraviesa por muchos cambios durante el embarazo, y estos varían de una mujer a otra.   · Al principio, puede aumentar o bajar algunos kilos.  · Puede tener malestar estomacal (náuseas) y vomitar. Si no puede controlar los vómitos, llame al médico.  · Puede cansarse con facilidad.  · Es posible que tenga dolores de cabeza que pueden aliviarse con los medicamentos que el médico le permita tomar.  · Puede orinar con mayor frecuencia. El dolor al orinar puede significar que usted tiene una infección de la vejiga.  · Debido al embarazo, puede tener acidez estomacal.  · Puede estar estreñida, ya que ciertas hormonas enlentecen los movimientos de los músculos que empujan los desechos a través de los intestinos.  · Pueden aparecer hemorroides o abultarse e hincharse las venas (venas varicosas).  · Las mamas pueden empezar a agrandarse y estar sensibles. Los pezones  pueden sobresalir más, y el tejido que los rodea (areola) tornarse más oscuro.  · Las encías pueden sangrar y estar sensibles al cepillado y al hilo dental.  · Pueden aparecer zonas oscuras o manchas (cloasma, máscara del embarazo) en el rostro que probablemente se atenuarán después del nacimiento del bebé.  · Los períodos menstruales se interrumpirán.  · Tal vez no tenga apetito.  · Puede sentir un fuerte deseo de consumir ciertos alimentos.  · Puede tener cambios a nivel emocional día a día, por ejemplo, por momentos puede estar emocionada por el embarazo y por otros preocuparse porque algo pueda salir mal con el embarazo o el bebé.  · Tendrá sueños más vívidos y extraños.  · Tal vez haya cambios en el cabello que pueden incluir su engrosamiento, crecimiento rápido y cambios en la textura. A algunas mujeres también se les cae el cabello durante o después del embarazo, o tienen el cabello seco o fino. Lo más probable es que el cabello se le normalice después del nacimiento del bebé.  QUÉ DEBE ESPERAR EN LAS CONSULTAS PRENATALES  Durante una visita prenatal de rutina:  · La pesarán para asegurarse de que usted y el bebé están creciendo normalmente.  · Le controlarán la presión arterial.  · Le medirán el abdomen para controlar el desarrollo del bebé.  · Se escucharán los latidos cardíacos a partir de la semana 10 o la 12 de embarazo, aproximadamente.  · Se analizarán los resultados de los estudios solicitados en visitas anteriores.  El   médico puede preguntarle:  · Cómo se siente.  · Si siente los movimientos del bebé.  · Si ha tenido síntomas anormales, como pérdida de líquido, sangrado, dolores de cabeza intensos o cólicos abdominales.  · Si está consumiendo algún producto que contenga tabaco, como cigarrillos, tabaco de mascar y cigarrillos electrónicos.  · Si tiene alguna pregunta.  Otros estudios que pueden realizarse durante el primer trimestre incluyen lo siguiente:  · Análisis de sangre para determinar el tipo  de sangre y detectar la presencia de infecciones previas. Además, se los usará para controlar si los niveles de hierro son bajos (anemia) y determinar los anticuerpos Rh. En una etapa más avanzada del embarazo, se harán análisis de sangre para saber si tiene diabetes, junto con otros estudios si surgen problemas.  · Análisis de orina para detectar infecciones, diabetes o proteínas en la orina.  · Una ecografía para confirmar que el bebé crece y se desarrolla correctamente.  · Una amniocentesis para diagnosticar posibles problemas genéticos.  · Estudios del feto para descartar espina bífida y síndrome de Down.  · Es posible que necesite otras pruebas adicionales.  · Prueba del VIH (virus de inmunodeficiencia humana). Los exámenes prenatales de rutina incluyen la prueba de detección del VIH, a menos que decida no realizársela.  INSTRUCCIONES PARA EL CUIDADO EN EL HOGAR   Medicamentos:  · Siga las indicaciones del médico en relación con el uso de medicamentos. Durante el embarazo, hay medicamentos que pueden tomarse y otros que no.  · Tome las vitaminas prenatales como se le indicó.  · Si está estreñida, tome un laxante suave, si el médico lo autoriza.  Dieta  · Consuma alimentos balanceados. Elija alimentos variados, como carne o proteínas de origen vegetal, pescado, leche y productos lácteos descremados, verduras, frutas y panes y cereales integrales. El médico la ayudará a determinar la cantidad de peso que puede aumentar.  · No coma carne cruda ni quesos sin cocinar. Estos elementos contienen bacterias que pueden causar defectos congénitos en el bebé.  · La ingesta diaria de cuatro o cinco comidas pequeñas en lugar de tres comidas abundantes puede ayudar a aliviar las náuseas y los vómitos. Si empieza a tener náuseas, comer algunas galletas saladas puede ser de ayuda. Beber líquidos entre las comidas en lugar de tomarlos durante las comidas también puede ayudar a calmar las náuseas y los vómitos.  · Si está  estreñida, consuma alimentos con alto contenido de fibra, como verduras y frutas frescas, y cereales integrales. Beba suficiente líquido para mantener la orina clara o de color amarillo pálido.  Actividad y ejercicios  · Haga ejercicio solamente como se lo haya indicado el médico. El ejercicio la ayudará a:    Controlar el peso.    Mantenerse en forma.    Estar preparada para el trabajo de parto y el parto.  · Los dolores, los cólicos en la parte baja del abdomen o los calambres en la cintura son un buen indicio de que debe dejar de hacer ejercicios. Consulte al médico antes de seguir haciendo ejercicios normales.  · Intente no estar de pie durante mucho tiempo. Mueva las piernas con frecuencia si debe estar de pie en un lugar durante mucho tiempo.  · Evite levantar pesos excesivos.  · Use zapatos de tacones bajos y mantenga una buena postura.  · Puede seguir teniendo relaciones sexuales, excepto que el médico le indique lo contrario.  Alivio del dolor o las molestias  · Use un sostén que le brinde buen   soporte si siente dolor a la palpación en las mamas.  · Dese baños de asiento con agua tibia para aliviar el dolor o las molestias causadas por las hemorroides. Use crema antihemorroidal si el médico se lo permite.  · Descanse con las piernas elevadas si tiene calambres o dolor de cintura.  · Si tiene venas varicosas en las piernas, use medias de descanso. Eleve los pies durante 15 minutos, 3 o 4 veces por día. Limite la cantidad de sal en su dieta.  Cuidados prenatales  · Programe las visitas prenatales para la semana 12 de embarazo. Generalmente se programan cada mes al principio y se hacen más frecuentes en los 2 últimos meses antes del parto.  · Escriba sus preguntas. Llévelas cuando concurra a las visitas prenatales.  · Concurra a todas las visitas prenatales como se lo haya indicado el médico.  Seguridad  · Colóquese el cinturón de seguridad cuando conduzca.  · Haga una lista de los números de teléfono de  emergencia, que incluya los números de teléfono de familiares, amigos, el hospital y los departamentos de policía y bomberos.  Consejos generales  · Pídale al médico que la derive a clases de educación prenatal en su localidad. Debe comenzar a tomar las clases antes de entrar en el mes 6 de embarazo.  · Pida ayuda si tiene necesidades nutricionales o de asesoramiento durante el embarazo. El médico puede aconsejarla o derivarla a especialistas para que la ayuden con diferentes necesidades.  · No se dé baños de inmersión en agua caliente, baños turcos ni saunas.  · No se haga duchas vaginales ni use tampones o toallas higiénicas perfumadas.  · No mantenga las piernas cruzadas durante mucho tiempo.  · Evite el contacto con las bandejas sanitarias de los gatos y la tierra que estos animales usan. Estos elementos contienen bacterias que pueden causar defectos congénitos al bebé y la posible pérdida del feto debido a un aborto espontáneo o muerte fetal.  · No fume, no consuma hierbas ni medicamentos que no hayan sido recetados por el médico. Las sustancias químicas que estos productos contienen afectan la formación y el desarrollo del bebé.  · No consuma ningún producto que contenga tabaco, lo que incluye cigarrillos, tabaco de mascar y cigarrillos electrónicos. Si necesita ayuda para dejar de fumar, consulte al médico. Puede recibir asesoramiento y otro tipo de recursos para dejar de fumar.  · Programe una cita con el dentista. En su casa, lávese los dientes con un cepillo dental blando y pásese el hilo dental con suavidad.  SOLICITE ATENCIÓN MÉDICA SI:   · Tiene mareos.  · Siente cólicos leves, presión en la pelvis o dolor persistente en el abdomen.  · Tiene náuseas, vómitos o diarrea persistentes.  · Tiene secreción vaginal con mal olor.  · Siente dolor al orinar.  · Tiene el rostro, las manos, las piernas o los tobillos más hinchados.  SOLICITE ATENCIÓN MÉDICA DE INMEDIATO SI:   · Tiene fiebre.  · Tiene una pérdida de  líquido por la vagina.  · Tiene sangrado o pequeñas pérdidas vaginales.  · Siente dolor intenso o cólicos en el abdomen.  · Sube o baja de peso rápidamente.  · Vomita sangre de color rojo brillante o material que parezca granos de café.  · Ha estado expuesta a la rubéola y no ha sufrido la enfermedad.  · Ha estado expuesta a la quinta enfermedad o a la varicela.  · Tiene un dolor de cabeza intenso.  · Le falta el aire.  · Sufre   cualquier tipo de traumatismo, por ejemplo, debido a una caída o un accidente automovilístico.     Esta información no tiene como fin reemplazar el consejo del médico. Asegúrese de hacerle al médico cualquier pregunta que tenga.     Document Released: 05/03/2005 Document Revised: 08/14/2014  Elsevier Interactive Patient Education ©2016 Elsevier Inc.

## 2015-10-14 NOTE — Progress Notes (Deleted)
Krystal Dixon is a 30 y.o. G1P0 at 3959w3d for routine follow up.  She reports headache and nausea.  See flow sheet for details.  Pelvic exam: gravida uterus, normal cervix, and vaginal vault, external vulva without any abnormalities.  FHR:144 via ultrasound, could not obtain via doppler   A/P: Pregnancy at 4759w3d. Doing well.   Pregnancy issues include nausea, patient was given B-6 and  Zofran for break through vomiting.  Anatomy ultrasound ordered to be scheduled at 18-19 weeks. Pt  is not interested in genetic screening. Bleeding and pain precautions reviewed. Follow up 4 weeks.

## 2015-10-14 NOTE — Progress Notes (Addendum)
Krystal Dixon is a 30 y.o. G1P0 at 753w3d for routine follow up.  She reports headache and nausea.  See flow sheet for details.  Abdomen: Gravida, soft, non-tender  Pelvic exam: gravida uterus, normal cervix, slight white discharge from cervix, normal vaginal vault, no tenderness on palpation, external vulva without any abnormalities.  FHR:144 via ultrasound, could not obtain via doppler   A/P: Pregnancy at 4853w3d. Doing well.   Obtained G/C probe and pap smear today, follow up results  Pregnancy issues include nausea, patient was given B-6 and  Zofran for break through vomiting.  Anatomy ultrasound ordered to be scheduled at 18-19 weeks. Pt  is not interested in genetic screening. Bleeding and pain precautions reviewed. Follow up 4 weeks. Slight leukocytosis on OB prenatal labs, will recheck CBC today.

## 2015-10-15 LAB — CYTOLOGY - PAP

## 2015-10-27 ENCOUNTER — Encounter: Payer: Self-pay | Admitting: Internal Medicine

## 2015-10-27 ENCOUNTER — Telehealth: Payer: Self-pay | Admitting: Internal Medicine

## 2015-10-27 NOTE — Telephone Encounter (Signed)
Rn with [facility]: pt needs a letter from dr stating [facility] is [facility] is [redacted] wks pregnant Fax number is   [phone]  Attn Joni ReiningNicole

## 2015-10-29 ENCOUNTER — Encounter: Payer: Self-pay | Admitting: Internal Medicine

## 2015-11-01 NOTE — Telephone Encounter (Signed)
[facility].

## 2015-11-10 ENCOUNTER — Ambulatory Visit (INDEPENDENT_AMBULATORY_CARE_PROVIDER_SITE_OTHER): Payer: Self-pay | Admitting: Internal Medicine

## 2015-11-10 VITALS — BP 106/74 | HR 79 | Temp 97.7°F | Wt 156.0 lb

## 2015-11-10 DIAGNOSIS — O0001 Abdominal pregnancy with intrauterine pregnancy: Secondary | ICD-10-CM

## 2015-11-10 NOTE — Progress Notes (Addendum)
Krystal Dixon is a 30 y.o. G2P0010 at 8720w2d for routine follow up.  She reports no complaints See flow sheet for details.  A/P: Pregnancy at 3220w2d.  Doing well.   Pregnancy issues include none  Anatomy ultrasound ordered to be scheduled at 18-19 weeks. Pt  is not interested in genetic screening. Bleeding and pain precautions reviewed. FHR 155, Fundal height 15 cm  Follow up 4 weeks. Provided info on vaginal bleeding in pregnancy

## 2015-11-10 NOTE — Patient Instructions (Signed)
Please return in 4 weeks    Hemorragia vaginal durante el embarazo (segundo trimestre) (Vaginal Bleeding During Pregnancy, Second Trimester)  Durante el embarazo, es comn tener una pequea hemorragia vaginal (manchas). A veces, la hemorragia es normal y no representa un problema, pero en algunas ocasiones es un sntoma de algo grave. Asegrese de decirle a su mdico de inmediato si tiene algn tipo de hemorragia vaginal. CUIDADOS EN EL HOGAR  Controle su afeccin para ver si hay cambios.  Siga las indicaciones de su mdico con respecto al Lochsloygrado de actividad que Curriepuede tener.  Si debe hacer reposo en cama:  Es posible que deba quedarse en cama y levantarse nicamente para ir al bao.  Quizs le permitan hacer The PNC Financialalgunas actividades.  Si es necesario, planifique que alguien la ayude.  Marcelino FreestoneEscriba:  La cantidad de toallas higinicas que [place]ausa cada da.  La frecuencia con la que se cambia las toallas higinicas.  Indique que tan empapados (saturados) estn.  No use tampones.  No se haga duchas vaginales.  No tenga relaciones sexuales ni orgasmos hasta que el mdico la autorice.  Si elimina tejido por la vagina, gurdelo para mostrrselo al American Expressmdico.  Tome los medicamentos solamente como se lo haya indicado el mdico.  No tome aspirina, ya que puede causar hemorragias.  No haga ejercicios, no levante objetos pesados ni haga ninguna actividad que exija mucha energa y esfuerzo, salvo que su mdico la autorice.  Concurra a todas las visitas de control como se lo haya indicado el mdico. SOLICITE AYUDA SI:   Tiene una hemorragia vaginal.  Tiene clicos.  Tiene dolores de Alanreedparto.  Tiene fiebre que no desaparece despus de Teacher, adult educationtomar medicamentos. SOLICITE AYUDA DE INMEDIATO SI:  Siente clicos muy intensos en la espalda o en el vientre (abdomen).  Siente contracciones.  Tiene escalofros.  Elimina cogulos grandes o tejido por la vagina.  Tiene ms hemorragia.  Se siente dbil o  que va a desvanecerse.  Pierde el conocimiento (se desmaya).  Tiene una prdida importante o sale lquido a borbotones por la vagina. ASEGRESE DE QUE:  Comprende estas instrucciones.  Controlar su afeccin.  Recibir ayuda de inmediato si no mejora o si empeora.   Esta informacin no tiene Theme park managercomo fin reemplazar el consejo del mdico. Asegrese de hacerle al mdico cualquier pregunta que tenga.   Document Released: 12/08/2013 Elsevier Interactive Patient Education [employer]2016 Elsevier Inc.

## 2015-11-10 NOTE — Addendum Note (Signed)
Addended by: Noralee CharsMIKELL, Ervie Mccard Z on: 11/10/2015 04:42 PM   Modules accepted: Orders

## 2015-11-22 ENCOUNTER — Ambulatory Visit (HOSPITAL_COMMUNITY)
Admission: RE | Admit: 2015-11-22 | Discharge: 2015-11-22 | Disposition: A | Discharge status: Home / Self Care | Payer: BLUE CROSS/BLUE SHIELD | Source: Ambulatory Visit | Attending: Family Medicine | Admitting: Family Medicine

## 2015-11-22 DIAGNOSIS — Z36 Encounter for antenatal screening of mother: Secondary | ICD-10-CM | POA: Insufficient documentation

## 2015-11-22 DIAGNOSIS — O0001 Abdominal pregnancy with intrauterine pregnancy: Secondary | ICD-10-CM

## 2015-11-22 DIAGNOSIS — Z3A19 19 weeks gestation of pregnancy: Secondary | ICD-10-CM | POA: Diagnosis not present

## 2015-11-29 ENCOUNTER — Telehealth: Payer: Self-pay | Admitting: *Deleted

## 2015-11-29 NOTE — Telephone Encounter (Signed)
Dr. Lorin PicketScott Rehm's office called stating patient was seen today.  Patient will require tooth extraction.  They need documentation on what medications the PCP prefers patient take post-surgery since she is pregnant.  Please give them a call at  [phone], fax [phone].  Clovis PuMartin, Caralyn Twining L, RN

## 2015-11-30 NOTE — Telephone Encounter (Signed)
I called Dr. Clemetine Markerehm's office today to discuss medical  management post tooth extraction. I recommended tylenol for pain control. If patient is having break through pain, she could use a low dose of codeine.  However I would recommend no more then 3 days uses of this medication. I would caution patient that such a pain medication, can result in possible respiratory depression in newborn, and therefore to weight benefits vs the risks before prescribing this medication. Furthermore, I stated that Penicillin V 500 mg post-surgery would be safe to use.    I have precepted these recommendations with Dr. Lum BabeEniola

## 2015-12-10 ENCOUNTER — Encounter: Payer: BLUE CROSS/BLUE SHIELD | Admitting: Internal Medicine

## 2015-12-24 ENCOUNTER — Ambulatory Visit (INDEPENDENT_AMBULATORY_CARE_PROVIDER_SITE_OTHER): Payer: BLUE CROSS/BLUE SHIELD | Admitting: Internal Medicine

## 2015-12-24 VITALS — BP 99/58 | HR 70 | Temp 98.0°F | Ht 63.0 in | Wt 159.9 lb

## 2015-12-24 DIAGNOSIS — O0001 Abdominal pregnancy with intrauterine pregnancy: Secondary | ICD-10-CM

## 2015-12-24 NOTE — Patient Instructions (Signed)

## 2015-12-24 NOTE — Progress Notes (Signed)
Krystal Dixon is a 30 y.o. G2P0010 at 6677w4d for routine follow up.  She reports feeling well. Has no complaints.   See flow sheet for details.  Physical Exam  Gravida female, 21.5 cm uterus, FHR 154,  Abdomen: no ttp No lower extremity edema   A/P: Pregnancy at 5277w4d.  Doing well.   Pregnancy issues include none  Anatomy scan reviewed, problems are noted.  Preterm labor precautions reviewed. [facility]

## 2016-01-27 ENCOUNTER — Ambulatory Visit (INDEPENDENT_AMBULATORY_CARE_PROVIDER_SITE_OTHER): Payer: Self-pay | Admitting: Family Medicine

## 2016-01-27 VITALS — BP 100/62 | HR 82 | Temp 98.5°F | Wt 163.0 lb

## 2016-01-27 DIAGNOSIS — Z3480 Encounter for supervision of other normal pregnancy, unspecified trimester: Secondary | ICD-10-CM | POA: Insufficient documentation

## 2016-01-27 DIAGNOSIS — Z3402 Encounter for supervision of normal first pregnancy, second trimester: Secondary | ICD-10-CM

## 2016-01-27 DIAGNOSIS — Z3483 Encounter for supervision of other normal pregnancy, third trimester: Secondary | ICD-10-CM

## 2016-01-27 LAB — GLUCOSE, CAPILLARY: GLUCOSE-CAPILLARY: 145 mg/dL — AB (ref 65–99)

## 2016-01-27 NOTE — Assessment & Plan Note (Signed)
  [facility] Prenatal Labs  Dating  U/S Blood type: O/POS/-- (02/02 1352)   Genetic Screen 1 Screen:    AFP:     Quad:     NIPS: Antibody:NEG (02/02 1352)  Anatomic US  Normal  Rubella: 5.45 (02/02 1352)  GTT Early: N/A              Third trimester:  RPR: NON REAC (02/02 1352)   Flu vaccine  HBsAg: NEGATIVE (02/02 1352)   TDaP vaccine                                               Rhogam: N/A HIV: NONREACTIVE (02/02 1352)   Baby Food  Bottle                                           GBS: (For PCN allergy, check sensitivities)  Contraception  undecided  Pap:  Circumcision  N/A   Pediatrician    Support Person

## 2016-01-27 NOTE — Patient Instructions (Signed)
Tercer trimestre de embarazo (Third Trimester of Pregnancy) El tercer trimestre comprende desde la semana29 hasta la semana42, es decir, desde el mes7 hasta el mes9. El tercer trimestre es un perodo en el que el feto crece rpidamente. Hacia el final del noveno mes, el feto mide alrededor de 20pulgadas (45cm) de largo y pesa entre 6 y 10 libras (2,700 y 4,500kg).  CAMBIOS EN EL ORGANISMO Su organismo atraviesa por muchos cambios durante el embarazo, y estos varan de una mujer a otra.   Seguir aumentando de peso. Es de esperar que aumente entre 25 y 35libras (11 y 16kg) hacia el final del embarazo.  Podrn aparecer las primeras estras en las caderas, el abdomen y las mamas.  Puede tener necesidad de orinar con ms frecuencia porque el feto baja hacia la pelvis y ejerce presin sobre la vejiga.  Debido al embarazo podr sentir acidez estomacal con frecuencia.  Puede estar estreida, ya que ciertas hormonas enlentecen los movimientos de los msculos que empujan los desechos a travs de los intestinos.  Pueden aparecer hemorroides o abultarse e hincharse las venas (venas varicosas).  Puede sentir dolor plvico debido al aumento de peso y a que las hormonas del embarazo relajan las articulaciones entre los huesos de la pelvis. El dolor de espalda puede ser consecuencia de la sobrecarga de los msculos que soportan la postura.  Tal vez haya cambios en el cabello que pueden incluir su engrosamiento, crecimiento rpido y cambios en la textura. Adems, a algunas mujeres se les cae el cabello durante o despus del embarazo, o tienen el cabello seco o fino. Lo ms probable es que el cabello se le normalice despus del nacimiento del beb.  Las mamas seguirn creciendo y le dolern. A veces, puede haber una secrecin amarilla de las mamas llamada calostro.  El ombligo puede salir hacia afuera.  Puede sentir que le falta el aire debido a que se expande el tero.  Puede notar que el feto  "baja" o lo siente ms bajo, en el abdomen.  Puede tener una prdida de secrecin mucosa con sangre. Esto suele ocurrir en el trmino de unos pocos das a una semana antes de que comience el trabajo de parto.  El cuello del tero se vuelve delgado y blando (se borra) cerca de la fecha de parto. QU DEBE ESPERAR EN LOS EXMENES PRENATALES  Le harn exmenes prenatales cada 2semanas hasta la semana36. A partir de ese momento le harn exmenes semanales. Durante una visita prenatal de rutina:  La pesarn para asegurarse de que usted y el feto estn creciendo normalmente.  Le tomarn la presin arterial.  Le medirn el abdomen para controlar el desarrollo del beb.  Se escucharn los latidos cardacos fetales.  Se evaluarn los resultados de los estudios solicitados en visitas anteriores.  Le revisarn el cuello del tero cuando est prxima la fecha de parto para controlar si este se ha borrado. Alrededor de la semana36, el mdico le revisar el cuello del tero. Al mismo tiempo, realizar un anlisis de las secreciones del tejido vaginal. Este examen es para determinar si hay un tipo de bacteria, estreptococo Grupo B. El mdico le explicar esto con ms detalle. El mdico puede preguntarle lo siguiente:  Cmo le gustara que fuera el parto.  Cmo se siente.  Si siente los movimientos del beb.  Si ha tenido sntomas anormales, como prdida de lquido, sangrado, dolores de cabeza intensos o clicos abdominales.  Si est consumiendo algn producto que contenga tabaco, como cigarrillos, tabaco   de mascar y cigarrillos electrnicos.  Si tiene alguna pregunta. Otros exmenes o estudios de deteccin que pueden realizarse durante el tercer trimestre incluyen lo siguiente:  Anlisis de sangre para controlar los niveles de hierro (anemia).  Controles fetales para determinar su salud, nivel de actividad y crecimiento. Si tiene alguna enfermedad o hay problemas durante el embarazo, le harn  estudios.  Prueba del VIH (virus de inmunodeficiencia humana). Si corre un riesgo alto, pueden realizarle una prueba de deteccin del VIH durante el tercer trimestre del embarazo. FALSO TRABAJO DE PARTO Es posible que sienta contracciones leves e irregulares que finalmente desaparecen. Se llaman contracciones de Braxton Hicks o falso trabajo de parto. Las contracciones pueden durar horas, das o incluso semanas, antes de que el verdadero trabajo de parto se inicie. Si las contracciones ocurren a intervalos regulares, se intensifican o se hacen dolorosas, lo mejor es que la revise el mdico.  SIGNOS DE TRABAJO DE PARTO   Clicos de tipo menstrual.  Contracciones cada 5minutos o menos.  Contracciones que comienzan en la parte superior del tero y se extienden hacia abajo, a la zona inferior del abdomen y la espalda.  Sensacin de mayor presin en la pelvis o dolor de espalda.  Una secrecin de mucosidad acuosa o con sangre que sale de la vagina. Si tiene alguno de estos signos antes de la semana37 del embarazo, llame a su mdico de inmediato. Debe concurrir al hospital para que la controlen inmediatamente. INSTRUCCIONES PARA EL CUIDADO EN EL HOGAR   Evite fumar, consumir hierbas, beber alcohol y tomar frmacos que no le hayan recetado. Estas sustancias qumicas afectan la formacin y el desarrollo del beb.  No consuma ningn producto que contenga tabaco, lo que incluye cigarrillos, tabaco de mascar y cigarrillos electrnicos. Si necesita ayuda para dejar de fumar, consulte al mdico. Puede recibir asesoramiento y otro tipo de recursos para dejar de fumar.  Siga las indicaciones del mdico en relacin con el uso de medicamentos. Durante el embarazo, hay medicamentos que son seguros de tomar y otros que no.  Haga ejercicio solamente como se lo haya indicado el mdico. Sentir clicos uterinos es un buen signo para detener la actividad fsica.  Contine comiendo alimentos sanos con  regularidad.  Use un sostn que le brinde buen soporte si le duelen las mamas.  No se d baos de inmersin en agua caliente, baos turcos ni saunas.  Use el cinturn de seguridad en todo momento mientras conduce.  No coma carne cruda ni queso sin cocinar; evite el contacto con las bandejas sanitarias de los gatos y la tierra que estos animales usan. Estos elementos contienen grmenes que pueden causar defectos congnitos en el beb.  Tome las vitaminas prenatales.  Tome entre 1500 y 2000mg de calcio diariamente comenzando en la semana20 del embarazo hasta el parto.  Si est estreida, pruebe un laxante suave (si el mdico lo autoriza). Consuma ms alimentos ricos en fibra, como vegetales y frutas frescos y cereales integrales. Beba gran cantidad de lquido para mantener la orina de tono claro o color amarillo plido.  Dese baos de asiento con agua tibia para aliviar el dolor o las molestias causadas por las hemorroides. Use una crema para las hemorroides si el mdico la autoriza.  Si tiene venas varicosas, use medias de descanso. Eleve los pies durante 15minutos, 3 o 4veces por da. Limite el consumo de sal en su dieta.  Evite levantar objetos pesados, use zapatos de tacones bajos y mantenga una buena postura.  Descanse   con las piernas elevadas si tiene calambres o dolor de cintura.  Visite a su dentista si no lo ha hecho durante el embarazo. Use un cepillo de dientes blando para higienizarse los dientes y psese el hilo dental con suavidad.  Puede seguir manteniendo relaciones sexuales, a menos que el mdico le indique lo contrario.  No haga viajes largos excepto que sea absolutamente necesario y solo con la autorizacin del mdico.  Tome clases prenatales para entender, practicar y hacer preguntas sobre el trabajo de parto y el parto.  Haga un ensayo de la partida al hospital.  Prepare el bolso que llevar al hospital.  Prepare la habitacin del beb.  Concurra a todas  las visitas prenatales segn las indicaciones de su mdico. SOLICITE ATENCIN MDICA SI:  No est segura de que est en trabajo de parto o de que ha roto la bolsa de las aguas.  Tiene mareos.  Siente clicos leves, presin en la pelvis o dolor persistente en el abdomen.  Tiene nuseas, vmitos o diarrea persistentes.  Observa una secrecin vaginal con mal olor.  Siente dolor al orinar. SOLICITE ATENCIN MDICA DE INMEDIATO SI:   Tiene fiebre.  Tiene una prdida de lquido por la vagina.  Tiene sangrado o pequeas prdidas vaginales.  Siente dolor intenso o clicos en el abdomen.  Sube o baja de peso rpidamente.  Tiene dificultad para respirar y siente dolor de pecho.  Sbitamente se le hinchan mucho el rostro, las manos, los tobillos, los pies o las piernas.  No ha sentido los movimientos del beb durante una hora.  Siente un dolor de cabeza intenso que no se alivia con medicamentos.  Su visin se modifica.   Esta informacin no tiene como fin reemplazar el consejo del mdico. Asegrese de hacerle al mdico cualquier pregunta que tenga.   Document Released: 05/03/2005 Document Revised: 08/14/2014 Elsevier Interactive Patient Education 2016 Elsevier Inc.  

## 2016-01-27 NOTE — Progress Notes (Signed)
Krystal Dixon is a 30 y.o. G1P0010 at 7269w3d for routine follow up.  She denies any vaginal bleeding, loss of fluid, or contractions. Prominent Xiphoid process.  See flow sheet for details.  A/P: Pregnancy at 869w3d.  Doing well.   Pregnancy issues include none  Infant feeding choice: bottle feeding   Contraception choice: not sure, disccussed options  FHR 145, Uterus size 25 cm   Tdapwas not given today. Recommend that she goes to the health department. Provide prescription.  1 hour glucola, CBC, RPR, and HIV were done today.   Pregnancy medical home forms and PHQ9 were done today and reviewed.   RH status was reviewed and pt does not need Rhogam.  Rhogam was not given today. Uterus size slight below average, however anatomy scan within normal limits. Will continue to follow closes  Childbirth and education classes from [facility]  Preterm labor precautions reviewed. Kick counts reviewed. Follow up 2 weeks.

## 2016-01-28 LAB — CBC
HEMATOCRIT: 33.7 % — AB (ref 35.0–45.0)
HEMOGLOBIN: 11.4 g/dL — AB (ref 11.7–15.5)
MCH: 26.8 pg — ABNORMAL LOW (ref 27.0–33.0)
MCHC: 33.8 g/dL (ref 32.0–36.0)
MCV: 79.3 fL — AB (ref 80.0–100.0)
MPV: 9 fL (ref 7.5–12.5)
Platelets: 261 10*3/uL (ref 140–400)
RBC: 4.25 MIL/uL (ref 3.80–5.10)
RDW: 14.4 % (ref 11.0–15.0)
WBC: 10.5 10*3/uL (ref 3.8–10.8)

## 2016-01-28 LAB — HIV ANTIBODY (ROUTINE TESTING W REFLEX): HIV 1&2 Ab, 4th Generation: NONREACTIVE

## 2016-01-28 LAB — SYPHILIS: RPR W/REFLEX TO RPR TITER AND TREPONEMAL ANTIBODIES, TRADITIONAL SCREENING AND DIAGNOSIS ALGORITHM

## 2016-02-04 ENCOUNTER — Other Ambulatory Visit (INDEPENDENT_AMBULATORY_CARE_PROVIDER_SITE_OTHER): Payer: Self-pay

## 2016-02-04 DIAGNOSIS — Z3402 Encounter for supervision of normal first pregnancy, second trimester: Secondary | ICD-10-CM

## 2016-02-04 LAB — GLUCOSE, CAPILLARY: GLUCOSE-CAPILLARY: 93 mg/dL (ref 65–99)

## 2016-02-05 LAB — GLUCOSE TOLERANCE, 3 HOURS
GLUCOSE 3 HOUR GTT: 112 mg/dL (ref <145)
GLUCOSE, 1 HOUR-GESTATIONAL: 158 mg/dL (ref <190)
GLUCOSE, 2 HOUR-GESTATIONAL: 88 mg/dL (ref <165)
Glucose Tolerance, Fasting: 85 mg/dL (ref 65–104)

## 2016-02-11 ENCOUNTER — Ambulatory Visit (INDEPENDENT_AMBULATORY_CARE_PROVIDER_SITE_OTHER): Payer: BLUE CROSS/BLUE SHIELD | Admitting: Internal Medicine

## 2016-02-11 VITALS — BP 103/59 | HR 74 | Temp 98.5°F | Wt 165.0 lb

## 2016-02-11 DIAGNOSIS — Z3483 Encounter for supervision of other normal pregnancy, third trimester: Secondary | ICD-10-CM

## 2016-02-11 NOTE — Progress Notes (Signed)
Aubri Ottis StainMartinezluna is a 30 y.o. G2P0010 at 2366w4d for routine follow up.  She reports no issues. No vaginal bleeding, loss of fluid, contractions, no swelling in her legs. Normal fetal movement  See flow sheet for details.  A/P: Pregnancy at 5166w4d.  Doing well.   Pregnancy issues include  Infant feeding choice - bottle feeding  Contraception choice - undecided, provided handout and discussion  FHR 142-143, Uterus size 28 cm   Prescription for Tdap was given, patient has an appointment at health department on the 11th of July  GBS/GC/CZ testing was not performed today, will be performed at 36 weeks  Uterus size slightly small at last visit now within normal limits  Patient's 1 hour GGT was elevated, however 3 GGT was within normal limits Preterm labor precautions reviewed. Safe sleep discussed. Kick counts reviewed. Follow up 2 weeks.

## 2016-02-11 NOTE — Patient Instructions (Signed)
Please follow up in two weeks   Tercer trimestre de Psychiatristembarazo (Third Trimester of Pregnancy) El tercer trimestre comprende desde la semana29 hasta la semana42, es decir, desde el mes7 hasta el [address]. El tercer trimestre es un perodo en el que el feto crece rpidamente. Hacia el final del noveno mes, el feto mide alrededor de 20pulgadas (45cm) de largo y pesa entre 6 y 10 libras (2,700 y 834,500kg).  CAMBIOS EN EL ORGANISMO Su organismo atraviesa por muchos cambios durante el Auroraembarazo, y estos varan de Neomia Dearuna mujer a Educational psychologistotra.   Seguir American Standard Companiesaumentando de peso. Es de esperar que aumente entre 25 y 35libras (11 y 16kg) hacia el final del Psychiatristembarazo.  Podrn aparecer las primeras Albertson'sestras en las caderas, el abdomen y las Sea Breezemamas.  Puede tener necesidad de Geographical information systems officerorinar con ms frecuencia porque el feto baja hacia la pelvis y ejerce presin sobre la vejiga.  Debido al Vanetta Muldersembarazo podr sentir Anthoney Haradaacidez estomacal con frecuencia.  Puede estar estreida, ya que ciertas hormonas enlentecen los movimientos de los msculos que New York Life Insuranceempujan los desechos a travs de los intestinos.  Pueden aparecer hemorroides o abultarse e hincharse las venas (venas varicosas).  Puede sentir dolor plvico debido al Con-wayaumento de peso y a que las hormonas del Management consultantembarazo relajan las articulaciones entre los huesos de la pelvis. El dolor de espalda puede ser consecuencia de la sobrecarga de los msculos que soportan la [place]te Valleypostura.  Tal vez haya cambios en el cabello que pueden incluir su engrosamiento, crecimiento rpido y cambios en la textura. Adems, a algunas mujeres se les cae el cabello durante o despus del embarazo, o tienen el cabello seco o fino. Lo ms probable es que el cabello se le normalice despus del nacimiento del beb.  Las ConAgra Foodsmamas seguirn creciendo y Development worker, communityle dolern. A veces, puede haber una secrecin amarilla de las mamas llamada calostro.  El ombligo puede salir hacia afuera.  Puede sentir que le falta el aire debido a que se expande el  tero.  Puede notar que el feto "baja" o lo siente ms bajo, en el abdomen.  Puede tener una prdida de secrecin mucosa con sangre. Esto suele ocurrir en el trmino de unos [address] das a una semana antes de que comience el Chicaltrabajo de Terrilparto.  El cuello del tero se vuelve delgado y blando (se borra) cerca de la fecha de [place]parto. QU DEBE ESPERAR EN LOS EXMENES PRENATALES  Le harn exmenes prenatales cada 2semanas hasta la semana36. A partir de ese momento le harn exmenes semanales. Durante una visita prenatal de rutina:  La pesarn para asegurarse de que usted y el feto estn creciendo normalmente.  Le tomarn la presin arterial.  Le medirn el abdomen para controlar el desarrollo del beb.  Se escucharn los latidos cardacos fetales.  Se evaluarn los resultados de los estudios solicitados en visitas anteriores.  Le revisarn el cuello del tero cuando est prxima la fecha de parto para controlar si este se ha borrado. Alrededor de la semana36, el mdico le revisar el cuello del tero. Al mismo tiempo, realizar un anlisis de las secreciones del tejido vaginal. Este examen es para determinar si hay un tipo de bacteria, estreptococo Grupo B. El mdico le explicar esto con ms detalle. El mdico puede preguntarle lo siguiente:  Cmo le gustara que fuera el [place]parto.  Cmo se siente.  Si siente los movimientos del beb.  Si ha tenido sntomas anormales, como prdida de lquido, Forrestonsangrado, dolores de cabeza intensos o clicos abdominales.  Si est consumiendo  algn producto que contenga tabaco, como cigarrillos, tabaco de Theatre manager y Administrator, Civil Service.  Si tiene [employer]gate[employer]Palmolive. Otros exmenes o estudios de deteccin que pueden realizarse durante el tercer trimestre incluyen lo siguiente:  Anlisis de sangre para controlar los niveles de hierro (anemia).  Controles fetales para determinar su salud, nivel de [place] y Designer, jewellery. Si tiene [place] enfermedad o hay  problemas durante el embarazo, le harn estudios.  Prueba del VIH (virus de inmunodeficiencia humana). Si corre Chiropodist, pueden realizarle una prueba de deteccin del VIH durante el tercer trimestre del embarazo. FALSO TRABAJO DE PARTO Es posible que sienta contracciones leves e irregulares que finalmente desaparecen. Se llaman contracciones de [address] o falso trabajo de [place]. Las [employer]anc[employer] pueden durar horas, [address] o incluso semanas, antes de que el verdadero trabajo de parto se inicie. Si las contracciones ocurren a intervalos regulares, se intensifican o se hacen dolorosas, lo mejor es que la revise el mdico.  SIGNOS DE TRABAJO DE PARTO   Clicos de tipo menstrual.  Contracciones cada o menos.  Contracciones que comienzan en la parte superior del tero y se extienden hacia abajo, a la zona inferior del abdomen y la espalda.  Sensacin de mayor presin en la pelvis o dolor de espalda.  Una secrecin de mucosidad acuosa o con sangre que sale de la vagina. Si tiene alguno de estos signos antes de la semana37 del Psychiatrist, llame a su mdico de inmediato. Debe concurrir al hospital para que la controlen inmediatamente. INSTRUCCIONES PARA EL CUIDADO EN EL HOGAR   Evite fumar, consumir hierbas, beber alcohol y tomar frmacos que no le hayan recetado. Estas sustancias qumicas afectan la formacin y el desarrollo del beb.  No consuma ningn producto que contenga tabaco, lo que incluye cigarrillos, tabaco de Theatre manager y Administrator, Civil Service. Si necesita ayuda para dejar de fumar, consulte al American Express. Puede recibir asesoramiento y otro tipo de recursos para dejar de fumar.  Siga las indicaciones del mdico en relacin con el uso de medicamentos. Durante el embarazo, hay medicamentos que son seguros de tomar y otros que no.  Haga ejercicio solamente como se lo haya indicado el mdico. Sentir clicos uterinos es un buen signo para Restaurant manager, fast food actividad fsica.  Contine  comiendo alimentos sanos con regularidad.  Use un sostn que le brinde buen soporte si le Altria Group.  No se d baos de inmersin en agua caliente, baos turcos ni saunas.  Use el cinturn de seguridad en todo momento mientras conduce.  No coma carne cruda ni queso sin cocinar; evite el contacto con las bandejas sanitarias de los gatos y la tierra que estos animales usan. Estos elementos contienen grmenes que pueden causar defectos congnitos en el beb.  Tome las vitaminas prenatales.  Tome entre 1500 y  de calcio diariamente comenzando en la semana20 del embarazo [place].  Si est estreida, pruebe un laxante suave (si el mdico lo autoriza). Consuma ms alimentos ricos en fibra, como vegetales y frutas frescos y Radiation protection practitioner. Beba gran cantidad de lquido para mantener la orina de tono claro o color amarillo plido.  Dese baos de asiento con agua tibia para Engineer, materials o las molestias causadas por las hemorroides. Use una crema para las hemorroides si el mdico la autoriza.  Si tiene venas varicosas, use medias de descanso. Eleve los pies durante , 3 o 4veces por da. Limite el consumo de sal en su dieta.  Evite levantar objetos pesados, use zapatos de tacones  bajos y Brazilmantenga una buena postura.  Descanse con las piernas elevadas si tiene calambres o dolor de cintura.  Visite a su dentista si no lo ha Occupational hygienisthecho durante el embarazo. Use un cepillo de dientes blando para higienizarse los dientes y psese el hilo dental con suavidad.  Puede seguir Calpine Corporationmanteniendo relaciones sexuales, a menos que el mdico le indique lo contrario.  No haga viajes largos excepto que sea absolutamente necesario y solo con la autorizacin del mdico.  Tome clases prenatales para Financial traderentender, Education administratorpracticar y hacer preguntas sobre el Windy Hillstrabajo de parto y Callaghanel parto.  Haga un ensayo de la partida al hospital.  Prepare el bolso que llevar al hospital.  Prepare la habitacin  del beb.  Concurra a todas las visitas prenatales segn las indicaciones de su mdico. SOLICITE ATENCIN MDICA SI:  No est segura de que est en trabajo de parto o de que ha roto la bolsa de las aguas.  Tiene mareos.  Siente clicos leves, presin en la pelvis o dolor persistente en el abdomen.  Tiene nuseas, vmitos o diarrea persistentes.  Brett Fairybserva una secrecin vaginal con mal olor.  Siente dolor al ConocoPhillipsorinar. SOLICITE ATENCIN MDICA DE INMEDIATO SI:   Tiene fiebre.  Tiene una prdida de lquido por la vagina.  Tiene sangrado o pequeas prdidas vaginales.  Siente dolor intenso o clicos en el abdomen.  Sube o baja de peso rpidamente.  Tiene dificultad para respirar y siente dolor de pecho.  Sbitamente se le hinchan mucho el rostro, las Renomanos, los tobillos, los pies o las piernas.  No ha sentido los movimientos del beb durante Georgianne Fickuna hora.  Siente un dolor de cabeza intenso que no se alivia con medicamentos.  Su visin se modifica.   Esta informacin no tiene Theme park managercomo fin reemplazar el consejo del mdico. Asegrese de hacerle al mdico cualquier pregunta que tenga.   Document Released: 05/03/2005 Document Revised: 08/14/2014 Elsevier Interactive Patient Education Yahoo! Inc2016 Elsevier Inc.

## 2016-02-18 ENCOUNTER — Encounter: Payer: Self-pay | Admitting: Internal Medicine

## 2016-02-18 ENCOUNTER — Ambulatory Visit (INDEPENDENT_AMBULATORY_CARE_PROVIDER_SITE_OTHER): Payer: BLUE CROSS/BLUE SHIELD | Admitting: Internal Medicine

## 2016-02-18 VITALS — BP 108/71 | HR 64 | Temp 98.3°F | Wt 165.4 lb

## 2016-02-18 DIAGNOSIS — L282 Other prurigo: Secondary | ICD-10-CM | POA: Diagnosis not present

## 2016-02-18 LAB — PROTIME-INR
INR: 1
Prothrombin Time: 10.2 s (ref 9.0–11.5)

## 2016-02-18 LAB — COMPLETE METABOLIC PANEL WITH GFR
ALBUMIN: 3.1 g/dL — AB (ref 3.6–5.1)
ALK PHOS: 87 U/L (ref 33–115)
ALT: 13 U/L (ref 6–29)
AST: 18 U/L (ref 10–30)
BILIRUBIN TOTAL: 0.3 mg/dL (ref 0.2–1.2)
BUN: 10 mg/dL (ref 7–25)
CO2: 22 mmol/L (ref 20–31)
CREATININE: 0.51 mg/dL (ref 0.50–1.10)
Calcium: 8.5 mg/dL — ABNORMAL LOW (ref 8.6–10.2)
Chloride: 104 mmol/L (ref 98–110)
GFR, Est Non African American: >89 mL/min (ref >=60)
Glucose, Bld: 83 mg/dL (ref 65–99)
Potassium: 4.2 mmol/L (ref 3.5–5.3)
Sodium: 139 mmol/L (ref 135–146)
TOTAL PROTEIN: 6.4 g/dL (ref 6.1–8.1)

## 2016-02-18 LAB — APTT: aPTT: 30 s (ref 22–34)

## 2016-02-18 MED ORDER — HYDROXYZINE HCL 25 MG PO TABS: 25.0000 mg | ORAL_TABLET | Freq: Three times a day (TID) | ORAL | Status: DC | PRN

## 2016-02-18 NOTE — Assessment & Plan Note (Signed)
Patient in her third trimester uncomplicated pregnancy.  No visible rash, some small excoriations from itching on palms and soles. Pruritic rash on hands and feet most concerning for intrahepatic cholestasis of pregnancy. - Bile acids, total - COMPLETE METABOLIC PANEL WITH GFR - hydrOXYzine (ATARAX/VISTARIL) 25 MG tablet; Take 1 tablet (25 mg total) by mouth 3 (three) times daily as needed.  Dispense: 30 tablet; Refill: 0 - Protime-INR and APTT - Follow up with results, if concerning for cholestasis, will start Ursodiol and will refer to high risk clinic.

## 2016-02-18 NOTE — Progress Notes (Signed)
   [facility] Noralee CharsAsiyah Heber Hoog, MD Phone: [phone]  Reason For Visit: Same Day, Rash during pregnancy   [name] is a G2P0010 at 4876w4d with an uncomplicated pregnancy course.   # Rash - Started on Monday evening with itching on the feet. By Tuesday, itching was on the hands as well  - Notes that rash is extremely itchy  - Itching is worse at night - On the palms/fingers and soles   - No other rashes or itching else where on the body - No hx of eczema or autoimmune diseases  - No family hx of eczema or other skin issues - Patient denies having an recent medications. She thought maybe she has an allergy to CitigroupBurger King as she generally does not eat there  - No pain in upper right quadrant  - Has tried hydrocortisone and rubbing EtOH on hands.    Past Medical History Reviewed problem list.  Medications- reviewed and updated No additions to family history Social history- patient is a non smoker  Objective: BP 108/71 mmHg  Pulse 64  Temp(Src) 98.3 F (36.8 C) (Oral)  Wt 165 lb 6.4 oz (75.025 kg)  LMP 06/14/2015 (Approximate) Gen: NAD, alert, cooperative with exam Abdomen: BS+, no ttp Skin : Small excoriations on the right and left hand as well as feet. No signs of papules or pustules on exam, no pain to palpation    Assessment/Plan: See problem based a/p  Pruritic rash Patient in her third trimester uncomplicated pregnancy.  No visible rash, some small excoriations from itching on palms and soles. Pruritic rash on hands and feet most concerning for intrahepatic cholestasis of pregnancy. - Bile acids, total - COMPLETE METABOLIC PANEL WITH GFR - hydrOXYzine (ATARAX/VISTARIL) 25 MG tablet; Take 1 tablet (25 mg total) by mouth 3 (three) times daily as needed.  Dispense: 30 tablet; Refill: 0 - Protime-INR and APTT - Follow up with results, if concerning for cholestasis, will start Ursodiol and will refer to high risk clinic.

## 2016-02-18 NOTE — Patient Instructions (Addendum)
We will follow up with the results of the labs we do within 24 hours. Give us a call if we don't let you know what they are. If you start having pain in right upper quadrant [facility]. Can use hydroxyzine as need every 6 hours as need for itching.

## 2016-02-22 ENCOUNTER — Telehealth: Payer: Self-pay | Admitting: Internal Medicine

## 2016-02-22 LAB — BILE ACIDS, TOTAL: BILE ACIDS TOTAL: 6 umol/L (ref 0–19)

## 2016-02-22 NOTE — Telephone Encounter (Signed)
Patient asks PCP for Lab results (taken on July 14). Please, follow up (Spanish).

## 2016-02-23 NOTE — Telephone Encounter (Signed)
Called patient to let her know the results of the labs this morning. However, she was not available. From lab results, does not look like patient has cholestasis of pregnancy, asked patient to return for follow up if she continues to have itching on feet and palms to discuss other possible diagnosis.

## 2016-02-29 ENCOUNTER — Ambulatory Visit (INDEPENDENT_AMBULATORY_CARE_PROVIDER_SITE_OTHER): Payer: BLUE CROSS/BLUE SHIELD | Admitting: Internal Medicine

## 2016-02-29 DIAGNOSIS — L282 Other prurigo: Secondary | ICD-10-CM

## 2016-02-29 MED ORDER — HYDROXYZINE HCL 25 MG PO TABS: 25.0000 mg | ORAL_TABLET | Freq: Three times a day (TID) | ORAL | 0 refills | Status: DC | PRN

## 2016-02-29 NOTE — Patient Instructions (Signed)
Tercer trimestre de embarazo (Third Trimester of Pregnancy) El tercer trimestre comprende desde la semana29 hasta la semana42, es decir, desde el mes7 hasta el mes9. El tercer trimestre es un perodo en el que el feto crece rpidamente. Hacia el final del noveno mes, el feto mide alrededor de 20pulgadas (45cm) de largo y pesa entre 6 y 10 libras (2,700 y 4,500kg).  CAMBIOS EN EL ORGANISMO Su organismo atraviesa por muchos cambios durante el embarazo, y estos varan de una mujer a otra.   Seguir aumentando de peso. Es de esperar que aumente entre 25 y 35libras (11 y 16kg) hacia el final del embarazo.  Podrn aparecer las primeras estras en las caderas, el abdomen y las mamas.  Puede tener necesidad de orinar con ms frecuencia porque el feto baja hacia la pelvis y ejerce presin sobre la vejiga.  Debido al embarazo podr sentir acidez estomacal con frecuencia.  Puede estar estreida, ya que ciertas hormonas enlentecen los movimientos de los msculos que empujan los desechos a travs de los intestinos.  Pueden aparecer hemorroides o abultarse e hincharse las venas (venas varicosas).  Puede sentir dolor plvico debido al aumento de peso y a que las hormonas del embarazo relajan las articulaciones entre los huesos de la pelvis. El dolor de espalda puede ser consecuencia de la sobrecarga de los msculos que soportan la postura.  Tal vez haya cambios en el cabello que pueden incluir su engrosamiento, crecimiento rpido y cambios en la textura. Adems, a algunas mujeres se les cae el cabello durante o despus del embarazo, o tienen el cabello seco o fino. Lo ms probable es que el cabello se le normalice despus del nacimiento del beb.  Las mamas seguirn creciendo y le dolern. A veces, puede haber una secrecin amarilla de las mamas llamada calostro.  El ombligo puede salir hacia afuera.  Puede sentir que le falta el aire debido a que se expande el tero.  Puede notar que el feto  "baja" o lo siente ms bajo, en el abdomen.  Puede tener una prdida de secrecin mucosa con sangre. Esto suele ocurrir en el trmino de unos pocos das a una semana antes de que comience el trabajo de parto.  El cuello del tero se vuelve delgado y blando (se borra) cerca de la fecha de parto. QU DEBE ESPERAR EN LOS EXMENES PRENATALES  Le harn exmenes prenatales cada 2semanas hasta la semana36. A partir de ese momento le harn exmenes semanales. Durante una visita prenatal de rutina:  La pesarn para asegurarse de que usted y el feto estn creciendo normalmente.  Le tomarn la presin arterial.  Le medirn el abdomen para controlar el desarrollo del beb.  Se escucharn los latidos cardacos fetales.  Se evaluarn los resultados de los estudios solicitados en visitas anteriores.  Le revisarn el cuello del tero cuando est prxima la fecha de parto para controlar si este se ha borrado. Alrededor de la semana36, el mdico le revisar el cuello del tero. Al mismo tiempo, realizar un anlisis de las secreciones del tejido vaginal. Este examen es para determinar si hay un tipo de bacteria, estreptococo Grupo B. El mdico le explicar esto con ms detalle. El mdico puede preguntarle lo siguiente:  Cmo le gustara que fuera el parto.  Cmo se siente.  Si siente los movimientos del beb.  Si ha tenido sntomas anormales, como prdida de lquido, sangrado, dolores de cabeza intensos o clicos abdominales.  Si est consumiendo algn producto que contenga tabaco, como cigarrillos, tabaco   de mascar y cigarrillos electrnicos.  Si tiene alguna pregunta. Otros exmenes o estudios de deteccin que pueden realizarse durante el tercer trimestre incluyen lo siguiente:  Anlisis de sangre para controlar los niveles de hierro (anemia).  Controles fetales para determinar su salud, nivel de actividad y crecimiento. Si tiene alguna enfermedad o hay problemas durante el embarazo, le harn  estudios.  Prueba del VIH (virus de inmunodeficiencia humana). Si corre un riesgo alto, pueden realizarle una prueba de deteccin del VIH durante el tercer trimestre del embarazo. FALSO TRABAJO DE PARTO Es posible que sienta contracciones leves e irregulares que finalmente desaparecen. Se llaman contracciones de Braxton Hicks o falso trabajo de parto. Las contracciones pueden durar horas, das o incluso semanas, antes de que el verdadero trabajo de parto se inicie. Si las contracciones ocurren a intervalos regulares, se intensifican o se hacen dolorosas, lo mejor es que la revise el mdico.  SIGNOS DE TRABAJO DE PARTO   Clicos de tipo menstrual.  Contracciones cada 5minutos o menos.  Contracciones que comienzan en la parte superior del tero y se extienden hacia abajo, a la zona inferior del abdomen y la espalda.  Sensacin de mayor presin en la pelvis o dolor de espalda.  Una secrecin de mucosidad acuosa o con sangre que sale de la vagina. Si tiene alguno de estos signos antes de la semana37 del embarazo, llame a su mdico de inmediato. Debe concurrir al hospital para que la controlen inmediatamente. INSTRUCCIONES PARA EL CUIDADO EN EL HOGAR   Evite fumar, consumir hierbas, beber alcohol y tomar frmacos que no le hayan recetado. Estas sustancias qumicas afectan la formacin y el desarrollo del beb.  No consuma ningn producto que contenga tabaco, lo que incluye cigarrillos, tabaco de mascar y cigarrillos electrnicos. Si necesita ayuda para dejar de fumar, consulte al mdico. Puede recibir asesoramiento y otro tipo de recursos para dejar de fumar.  Siga las indicaciones del mdico en relacin con el uso de medicamentos. Durante el embarazo, hay medicamentos que son seguros de tomar y otros que no.  Haga ejercicio solamente como se lo haya indicado el mdico. Sentir clicos uterinos es un buen signo para detener la actividad fsica.  Contine comiendo alimentos sanos con  regularidad.  Use un sostn que le brinde buen soporte si le duelen las mamas.  No se d baos de inmersin en agua caliente, baos turcos ni saunas.  Use el cinturn de seguridad en todo momento mientras conduce.  No coma carne cruda ni queso sin cocinar; evite el contacto con las bandejas sanitarias de los gatos y la tierra que estos animales usan. Estos elementos contienen grmenes que pueden causar defectos congnitos en el beb.  Tome las vitaminas prenatales.  Tome entre 1500 y 2000mg de calcio diariamente comenzando en la semana20 del embarazo hasta el parto.  Si est estreida, pruebe un laxante suave (si el mdico lo autoriza). Consuma ms alimentos ricos en fibra, como vegetales y frutas frescos y cereales integrales. Beba gran cantidad de lquido para mantener la orina de tono claro o color amarillo plido.  Dese baos de asiento con agua tibia para aliviar el dolor o las molestias causadas por las hemorroides. Use una crema para las hemorroides si el mdico la autoriza.  Si tiene venas varicosas, use medias de descanso. Eleve los pies durante 15minutos, 3 o 4veces por da. Limite el consumo de sal en su dieta.  Evite levantar objetos pesados, use zapatos de tacones bajos y mantenga una buena postura.  Descanse   con las piernas elevadas si tiene calambres o dolor de cintura.  Visite a su dentista si no lo ha hecho durante el embarazo. Use un cepillo de dientes blando para higienizarse los dientes y psese el hilo dental con suavidad.  Puede seguir manteniendo relaciones sexuales, a menos que el mdico le indique lo contrario.  No haga viajes largos excepto que sea absolutamente necesario y solo con la autorizacin del mdico.  Tome clases prenatales para entender, practicar y hacer preguntas sobre el trabajo de parto y el parto.  Haga un ensayo de la partida al hospital.  Prepare el bolso que llevar al hospital.  Prepare la habitacin del beb.  Concurra a todas  las visitas prenatales segn las indicaciones de su mdico. SOLICITE ATENCIN MDICA SI:  No est segura de que est en trabajo de parto o de que ha roto la bolsa de las aguas.  Tiene mareos.  Siente clicos leves, presin en la pelvis o dolor persistente en el abdomen.  Tiene nuseas, vmitos o diarrea persistentes.  Observa una secrecin vaginal con mal olor.  Siente dolor al orinar. SOLICITE ATENCIN MDICA DE INMEDIATO SI:   Tiene fiebre.  Tiene una prdida de lquido por la vagina.  Tiene sangrado o pequeas prdidas vaginales.  Siente dolor intenso o clicos en el abdomen.  Sube o baja de peso rpidamente.  Tiene dificultad para respirar y siente dolor de pecho.  Sbitamente se le hinchan mucho el rostro, las manos, los tobillos, los pies o las piernas.  No ha sentido los movimientos del beb durante una hora.  Siente un dolor de cabeza intenso que no se alivia con medicamentos.  Su visin se modifica.   Esta informacin no tiene como fin reemplazar el consejo del mdico. Asegrese de hacerle al mdico cualquier pregunta que tenga.   Document Released: 05/03/2005 Document Revised: 08/14/2014 Elsevier Interactive Patient Education 2016 Elsevier Inc.  

## 2016-02-29 NOTE — Progress Notes (Signed)
Krystal Dixon is a 30 y.o. G2P0010 at [redacted]w[redacted]d for routine follow up.  She reports no bleeding, gush of fluid, or contractions. Patient does have some swelling in her feet.   See flow sheet for details.  A/P: Pregnancy at [redacted]w[redacted]d.  Doing well.   Pregnancy issues include itching on palms and feet   Infant feeding choice Bottle Contraception choice: Condoms   FHT 143 bpm  Uterine Size 33 cm   Tdapwas given at the health department  GBS/GC/CZ testing was not performed today. Will be performed at 36 weeks Patient needs an OB follow up for third trimester Itching on palms and feet, worked up for cholestasis of pregnancy with bile acid at 6 (less than 11). AST/ALT wnl. Therefore no concern for cholestasis of pregnancy.  No dermatitis noted. Discussed with Dr. McDiramid. Will provide symptomatic treatment.   Preterm labor precautions reviewed. Safe sleep discussed. Kick counts reviewed. Follow up 2 weeks.

## 2016-03-09 ENCOUNTER — Ambulatory Visit (INDEPENDENT_AMBULATORY_CARE_PROVIDER_SITE_OTHER): Payer: BLUE CROSS/BLUE SHIELD | Admitting: Family Medicine

## 2016-03-09 VITALS — BP 119/74 | HR 94 | Temp 98.4°F | Wt 171.8 lb

## 2016-03-09 DIAGNOSIS — Z3483 Encounter for supervision of other normal pregnancy, third trimester: Secondary | ICD-10-CM

## 2016-03-09 NOTE — Patient Instructions (Signed)
Alivio del dolor durante el trabajo de parto y el parto (Pain Relief During Labor and Delivery) Todas las personas experimentan el dolor de manera diferente, pero el trabajo de parto causa dolor intenso en muchas mujeres. El nivel de dolor que usted experimenta en el trabajo del parto depende de su tolerancia al dolor, de la intensidad de las contracciones y del tamao y posicin del beb. Hay muchas formas de prepararse y enfrentar el dolor, por ejemplo:   Tomar clases de preparto para aprender acerca del trabajo de parto y el parto. Cuanto ms informada est, menos ansiedad y temor tendr. Esto puede ayudar a disminuir el dolor.  Tomar medicamentos para aliviar el dolor durante el trabajo de parto y el parto.  Aprender y utilizar tcnicas de relajacin.  Tomar una ducha o un bao de inmersin.  Recibir masajes.  Cambiar de posicin.  Colocarse una bolsa de hielo en la espalda. Comente las opciones para el control del dolor con su mdico durante las visitas prenatales.  CULES SON LAS DOS OPCIONES DE MEDICAMENTOS PARA ALIVIAR EL DOLOR? 1. Analgsicos. Estos son medicamentos que disminuyen el dolor sin que se pierdan totalmente la sensibilidad o los movimientos musculares. 2. Anestsicos. Estos son medicamentos que bloquean toda la sensibilidad, incluso el dolor. Ambos tipos tienen efectos secundarios mnimos, como nuseas, dificultad para concentrarse, estar somnolienta y disminuir la frecuencia cardaca del beb. Sin embargo, el mdico tendr la precaucin de administrar dosis que no afecten demasiado al beb.  CULES SON LOS TIPOS ESPECFICOS DE ANALGSICOS Y ANESTSICOS? Analgsico sistmico Los medicamentos para el dolor sistmicos no se concentran el aliviar el dolor en la zona en la que lo siente sino que afectan todo su organismo. Este tipo de medicamento se administra a travs de una va IV en la vena o de modo inyectable (inyeccin) en el msculo. Este medicamento aliviar el  dolor pero no lo calmar totalmente. Tambin la har sentir somnolienta, pero no har que pierda la conciencia.  Anestsico local El anestsico local seutiliza paraadormecer una pequea zona del cuerpo. El medicamento se inyecta en la zona de los nervios que transmiten la sensibilidad a la vagina, la vulva o la zona que se encuentra entre la vagina y el ano (perineo). Anestesia general Este tipo de medicamento provoca la prdida de la conciencia de modo que no sentir dolor. Generalmente se usa solo en caso de situaciones de emergencia durante el trabajo de parto. Se administra a travs de una va IV o una mscara facial. Bloqueo paracervical Un bloqueo paracervical es una forma de anestesia local que se administra durante el trabajo de parto. Se inyecta el anestsico en las zonas derecha e izquierda del cuello del tero y la vagina. Esto ayuda a disminuir el dolor causado por las contracciones y el estiramiento del cuello del tero. Puede administrase ms de una vez.  Bloqueo pudendo El bloqueo pudendo es otra forma de anestesia local. Se utiliza para aliviar el dolor asociado con la presin o la distensin del perineo en el momento del parto. Se aplica una inyeccin de manera profunda en la pared vaginal hacia el nervio pudendo en la pelvis, adormeciendo el perineo  Anestesia epidural La epidural es una inyeccin de medicamento anestsico que se aplica en la parte inferior de la espalda y en el espacio epidural, cerca de la mdula espinal. La epidural adormece la mitad inferior del cuerpo. Podr mover las piernas, pero no le permitirn caminar. La epidural se usa en el trabajo de parto, el parto   parto o la cesrea.  Para evitar que se termine el efecto del medicamento, le colocarn un tubo pequeo (catter) en el espacio epidural y lo pegarn con cinta en el lugar para evitar que se salga. Luego le administran el medicamento de Dixon continua, en pequeas dosis, a travs del tubo, hasta que finalice el  [place]. Bloqueo espinal El bloqueo espinal es similar a la epidural, pero los medicamentos se inyectan en el lquido espinal y no en el espacio epidural. El bloqueo espinal solo se aplica una vez. Comienza a Dispensing optician, pero solo dura 1 o 2horas. El bloqueo espinal tambin se [place] en las cesreas.  Bloqueo espinal-epidural combinado El bloqueo espinal-epidural combinado rene los beneficios de ambos mtodos. La parte espinal acta rpidamente para Engineer, materials y la epidural proporciona alivio continuo del Engineer, mining. Hidroterapia La inmersin en agua caliente durante el trabajo de parto puede proporcionar comodidad y relax. Tambin ayuda a [employer], el uso de anestesia y la duracin del [place]. Sin embargo, la inmersin en agua durante el parto (parto en el agua) puede Advertising copywriter, y se estn realizando estudios para Chief Strategy Officer su seguridad y sus riesgos. Si es [employer]isource[employer]ergen Corporation sana que espera un parto sin complicaciones, hable con su mdico para ver si el parto en el agua es una opcin para usted.    Esta informacin no tiene Theme park manager el consejo del mdico. Asegrese de hacerle al mdico cualquier pregunta que tenga.   Document Released: 05/21/2009 Document Revised: 07/29/2013 Elsevier Interactive Patient Education Yahoo! Inc.

## 2016-03-10 MED ORDER — PRENATAL VITAMINS 0.8 MG PO TABS: 1.0000 | ORAL_TABLET | Freq: Every day | ORAL | Status: DC

## 2016-03-10 NOTE — Addendum Note (Signed)
Addended by: Latrelle Dodrill on: 03/10/2016 06:09 PM   Modules accepted: Orders

## 2016-03-10 NOTE — Progress Notes (Signed)
[facility]  Primary Prenatal Care Provider: Dr. Colon Flattery [name] is a 30 y.o. G2P0010 at [redacted]w[redacted]d (via [redacted]w[redacted]d ultrasound) who presents to faculty [facility] clinic for a routine prenatal visit. She is doing well. Denies cramping/ctx, fluid leaking, vaginal bleeding, or decreased fetal movement.     Reviewed OB history with patient. Has one prior SAB in February 2016 at around 2 months gestation. Did not require any procedures or medication to manage the SAB.  Also reviewed prenatal course thus far in this pregnancy. Pap normal, gc/chl neg, unremarkable prenatal labs, got Tdap at health dept, anatomy ultrasound normal.   Delivery Planning: planning for SVD, contemplating options for pain management in labor Contraception: considering her options, has information already given to her Feeding: will try breastfeeding, maybe also formula. Discussed benefits of breastfeeding. Circumcision: n/a having a girl Pediatrician: unsure, offered pediatric care here at [facility].   Pregnancy issues include: - resolved prior itchy hands/soles of feet - reported previously by patient, but has now resolved. Had normal bile acids. Continue to monitor. - abnormal 1 hour gtt - with subsequent normal 3 hour gtt  Discussed labor and fetal movement precautions. Follow up around August 14 ([redacted]w[redacted]d). Will need GBS, gc/chlamydia, flu shot, and confirmation of fetal presentation at that visit.  Krystal Feinstein, MD [facility]

## 2016-03-14 ENCOUNTER — Encounter: Payer: BLUE CROSS/BLUE SHIELD | Admitting: Internal Medicine

## 2016-03-21 ENCOUNTER — Ambulatory Visit (INDEPENDENT_AMBULATORY_CARE_PROVIDER_SITE_OTHER): Payer: Self-pay | Admitting: Internal Medicine

## 2016-03-21 ENCOUNTER — Other Ambulatory Visit (HOSPITAL_COMMUNITY)
Admission: RE | Admit: 2016-03-21 | Discharge: 2016-03-21 | Disposition: A | Discharge status: Home / Self Care | Payer: BLUE CROSS/BLUE SHIELD | Source: Ambulatory Visit | Attending: Family Medicine | Admitting: Family Medicine

## 2016-03-21 VITALS — BP 110/73 | HR 81 | Temp 97.9°F | Wt 175.0 lb

## 2016-03-21 DIAGNOSIS — Z3483 Encounter for supervision of other normal pregnancy, third trimester: Secondary | ICD-10-CM

## 2016-03-21 DIAGNOSIS — Z113 Encounter for screening for infections with a predominantly sexual mode of transmission: Secondary | ICD-10-CM | POA: Insufficient documentation

## 2016-03-21 LAB — OB RESULTS CONSOLE GC/CHLAMYDIA: Gonorrhea: NEGATIVE

## 2016-03-21 NOTE — Progress Notes (Signed)
Jaleea Ottis StainMartinezluna is a 30 y.o. G2P0010 at 2851w1d for routine follow up.  She reports no vaginal bleeding, no contractions, no leakage of fluid or decreased fetal movement.   See flow sheet for details.  A/P: Pregnancy at 3851w1d.  Doing well.   Pregnancy issues include slight swelling bilaterally in ankles  FHT: 140 bpm  Uterine Size: 36 cm  Low extremity: Non-pitting edema of ankles   Infant feeding choice: Bottle feeding per patient  Contraception choice: condoms  Infant circumcision desired not applicable  Tdap previously given at the health department  GBS/GC/CZ testing was performed today. Ultrasound was performed and patient was found to be cephalic   Preterm labor precautions reviewed. Safe sleep discussed. Kick counts reviewed. Follow up 2 weeks.

## 2016-03-21 NOTE — Patient Instructions (Signed)
Tercer trimestre de embarazo (Third Trimester of Pregnancy) El tercer trimestre comprende desde la semana29 hasta la semana42, es decir, desde el mes7 hasta el mes9. El tercer trimestre es un perodo en el que el feto crece rpidamente. Hacia el final del noveno mes, el feto mide alrededor de 20pulgadas (45cm) de largo y pesa entre 6 y 10 libras (2,700 y 4,500kg).  CAMBIOS EN EL ORGANISMO Su organismo atraviesa por muchos cambios durante el embarazo, y estos varan de una mujer a otra.   Seguir aumentando de peso. Es de esperar que aumente entre 25 y 35libras (11 y 16kg) hacia el final del embarazo.  Podrn aparecer las primeras estras en las caderas, el abdomen y las mamas.  Puede tener necesidad de orinar con ms frecuencia porque el feto baja hacia la pelvis y ejerce presin sobre la vejiga.  Debido al embarazo podr sentir acidez estomacal con frecuencia.  Puede estar estreida, ya que ciertas hormonas enlentecen los movimientos de los msculos que empujan los desechos a travs de los intestinos.  Pueden aparecer hemorroides o abultarse e hincharse las venas (venas varicosas).  Puede sentir dolor plvico debido al aumento de peso y a que las hormonas del embarazo relajan las articulaciones entre los huesos de la pelvis. El dolor de espalda puede ser consecuencia de la sobrecarga de los msculos que soportan la postura.  Tal vez haya cambios en el cabello que pueden incluir su engrosamiento, crecimiento rpido y cambios en la textura. Adems, a algunas mujeres se les cae el cabello durante o despus del embarazo, o tienen el cabello seco o fino. Lo ms probable es que el cabello se le normalice despus del nacimiento del beb.  Las mamas seguirn creciendo y le dolern. A veces, puede haber una secrecin amarilla de las mamas llamada calostro.  El ombligo puede salir hacia afuera.  Puede sentir que le falta el aire debido a que se expande el tero.  Puede notar que el feto  "baja" o lo siente ms bajo, en el abdomen.  Puede tener una prdida de secrecin mucosa con sangre. Esto suele ocurrir en el trmino de unos pocos das a una semana antes de que comience el trabajo de parto.  El cuello del tero se vuelve delgado y blando (se borra) cerca de la fecha de parto. QU DEBE ESPERAR EN LOS EXMENES PRENATALES  Le harn exmenes prenatales cada 2semanas hasta la semana36. A partir de ese momento le harn exmenes semanales. Durante una visita prenatal de rutina:  La pesarn para asegurarse de que usted y el feto estn creciendo normalmente.  Le tomarn la presin arterial.  Le medirn el abdomen para controlar el desarrollo del beb.  Se escucharn los latidos cardacos fetales.  Se evaluarn los resultados de los estudios solicitados en visitas anteriores.  Le revisarn el cuello del tero cuando est prxima la fecha de parto para controlar si este se ha borrado. Alrededor de la semana36, el mdico le revisar el cuello del tero. Al mismo tiempo, realizar un anlisis de las secreciones del tejido vaginal. Este examen es para determinar si hay un tipo de bacteria, estreptococo Grupo B. El mdico le explicar esto con ms detalle. El mdico puede preguntarle lo siguiente:  Cmo le gustara que fuera el parto.  Cmo se siente.  Si siente los movimientos del beb.  Si ha tenido sntomas anormales, como prdida de lquido, sangrado, dolores de cabeza intensos o clicos abdominales.  Si est consumiendo algn producto que contenga tabaco, como cigarrillos, tabaco   de mascar y cigarrillos electrnicos.  Si tiene alguna pregunta. Otros exmenes o estudios de deteccin que pueden realizarse durante el tercer trimestre incluyen lo siguiente:  Anlisis de sangre para controlar los niveles de hierro (anemia).  Controles fetales para determinar su salud, nivel de actividad y crecimiento. Si tiene alguna enfermedad o hay problemas durante el embarazo, le harn  estudios.  Prueba del VIH (virus de inmunodeficiencia humana). Si corre un riesgo alto, pueden realizarle una prueba de deteccin del VIH durante el tercer trimestre del embarazo. FALSO TRABAJO DE PARTO Es posible que sienta contracciones leves e irregulares que finalmente desaparecen. Se llaman contracciones de Braxton Hicks o falso trabajo de parto. Las contracciones pueden durar horas, das o incluso semanas, antes de que el verdadero trabajo de parto se inicie. Si las contracciones ocurren a intervalos regulares, se intensifican o se hacen dolorosas, lo mejor es que la revise el mdico.  SIGNOS DE TRABAJO DE PARTO   Clicos de tipo menstrual.  Contracciones cada 5minutos o menos.  Contracciones que comienzan en la parte superior del tero y se extienden hacia abajo, a la zona inferior del abdomen y la espalda.  Sensacin de mayor presin en la pelvis o dolor de espalda.  Una secrecin de mucosidad acuosa o con sangre que sale de la vagina. Si tiene alguno de estos signos antes de la semana37 del embarazo, llame a su mdico de inmediato. Debe concurrir al hospital para que la controlen inmediatamente. INSTRUCCIONES PARA EL CUIDADO EN EL HOGAR   Evite fumar, consumir hierbas, beber alcohol y tomar frmacos que no le hayan recetado. Estas sustancias qumicas afectan la formacin y el desarrollo del beb.  No consuma ningn producto que contenga tabaco, lo que incluye cigarrillos, tabaco de mascar y cigarrillos electrnicos. Si necesita ayuda para dejar de fumar, consulte al mdico. Puede recibir asesoramiento y otro tipo de recursos para dejar de fumar.  Siga las indicaciones del mdico en relacin con el uso de medicamentos. Durante el embarazo, hay medicamentos que son seguros de tomar y otros que no.  Haga ejercicio solamente como se lo haya indicado el mdico. Sentir clicos uterinos es un buen signo para detener la actividad fsica.  Contine comiendo alimentos sanos con  regularidad.  Use un sostn que le brinde buen soporte si le duelen las mamas.  No se d baos de inmersin en agua caliente, baos turcos ni saunas.  Use el cinturn de seguridad en todo momento mientras conduce.  No coma carne cruda ni queso sin cocinar; evite el contacto con las bandejas sanitarias de los gatos y la tierra que estos animales usan. Estos elementos contienen grmenes que pueden causar defectos congnitos en el beb.  Tome las vitaminas prenatales.  Tome entre 1500 y 2000mg de calcio diariamente comenzando en la semana20 del embarazo hasta el parto.  Si est estreida, pruebe un laxante suave (si el mdico lo autoriza). Consuma ms alimentos ricos en fibra, como vegetales y frutas frescos y cereales integrales. Beba gran cantidad de lquido para mantener la orina de tono claro o color amarillo plido.  Dese baos de asiento con agua tibia para aliviar el dolor o las molestias causadas por las hemorroides. Use una crema para las hemorroides si el mdico la autoriza.  Si tiene venas varicosas, use medias de descanso. Eleve los pies durante 15minutos, 3 o 4veces por da. Limite el consumo de sal en su dieta.  Evite levantar objetos pesados, use zapatos de tacones bajos y mantenga una buena postura.  Descanse   con las piernas elevadas si tiene calambres o dolor de cintura.  Visite a su dentista si no lo ha hecho durante el embarazo. Use un cepillo de dientes blando para higienizarse los dientes y psese el hilo dental con suavidad.  Puede seguir manteniendo relaciones sexuales, a menos que el mdico le indique lo contrario.  No haga viajes largos excepto que sea absolutamente necesario y solo con la autorizacin del mdico.  Tome clases prenatales para entender, practicar y hacer preguntas sobre el trabajo de parto y el parto.  Haga un ensayo de la partida al hospital.  Prepare el bolso que llevar al hospital.  Prepare la habitacin del beb.  Concurra a todas  las visitas prenatales segn las indicaciones de su mdico. SOLICITE ATENCIN MDICA SI:  No est segura de que est en trabajo de parto o de que ha roto la bolsa de las aguas.  Tiene mareos.  Siente clicos leves, presin en la pelvis o dolor persistente en el abdomen.  Tiene nuseas, vmitos o diarrea persistentes.  Observa una secrecin vaginal con mal olor.  Siente dolor al orinar. SOLICITE ATENCIN MDICA DE INMEDIATO SI:   Tiene fiebre.  Tiene una prdida de lquido por la vagina.  Tiene sangrado o pequeas prdidas vaginales.  Siente dolor intenso o clicos en el abdomen.  Sube o baja de peso rpidamente.  Tiene dificultad para respirar y siente dolor de pecho.  Sbitamente se le hinchan mucho el rostro, las manos, los tobillos, los pies o las piernas.  No ha sentido los movimientos del beb durante una hora.  Siente un dolor de cabeza intenso que no se alivia con medicamentos.  Su visin se modifica.   Esta informacin no tiene como fin reemplazar el consejo del mdico. Asegrese de hacerle al mdico cualquier pregunta que tenga.   Document Released: 05/03/2005 Document Revised: 08/14/2014 Elsevier Interactive Patient Education 2016 Elsevier Inc.  

## 2016-03-22 LAB — GC/CHLAMYDIA PROBE AMP (~~LOC~~) NOT AT ARMC
Chlamydia: NEGATIVE
NEISSERIA GONORRHEA: NEGATIVE

## 2016-03-23 LAB — STREP B DNA PROBE: GBSP: NOT DETECTED

## 2016-04-05 ENCOUNTER — Ambulatory Visit (INDEPENDENT_AMBULATORY_CARE_PROVIDER_SITE_OTHER): Payer: Self-pay | Admitting: Family Medicine

## 2016-04-05 VITALS — BP 128/80 | HR 56 | Temp 98.2°F | Wt 178.6 lb

## 2016-04-05 DIAGNOSIS — O163 Unspecified maternal hypertension, third trimester: Secondary | ICD-10-CM

## 2016-04-05 DIAGNOSIS — O133 Gestational [pregnancy-induced] hypertension without significant proteinuria, third trimester: Secondary | ICD-10-CM

## 2016-04-05 LAB — POCT URINALYSIS DIPSTICK
Bilirubin, UA: NEGATIVE
Glucose, UA: NEGATIVE
Ketones, UA: NEGATIVE
LEUKOCYTES UA: NEGATIVE
NITRITE UA: NEGATIVE
PH UA: 6
Protein, UA: >=300
RBC UA: NEGATIVE
UROBILINOGEN UA: 0.2

## 2016-04-05 LAB — CBC
HEMATOCRIT: 34.5 % — AB (ref 35.0–45.0)
HEMOGLOBIN: 11.5 g/dL — AB (ref 11.7–15.5)
MCH: 26.1 pg — ABNORMAL LOW (ref 27.0–33.0)
MCHC: 33.3 g/dL (ref 32.0–36.0)
MCV: 78.2 fL — AB (ref 80.0–100.0)
MPV: 11.1 fL (ref 7.5–12.5)
Platelets: 188 10*3/uL (ref 140–400)
RBC: 4.41 MIL/uL (ref 3.80–5.10)
RDW: 15.2 % — AB (ref 11.0–15.0)
WBC: 7.6 10*3/uL (ref 3.8–10.8)

## 2016-04-05 LAB — POCT UA - MICROSCOPIC ONLY

## 2016-04-05 NOTE — Progress Notes (Signed)
Krystal Dixon is a 30 y.o. G2P0010 at 6069w2d for routine follow up. Spanish interpreter was used throughout encounter. She reports 2 episodes of blurred vision on Friday and Saturday. These lasted for 2-3 hours before resolving after she laid down to rest. Also notes headaches on Monday and Tuesday this week. Notes some lower extremity swelling however this has been improving. Reports some mild right upper quadrant pressure versus pain, but unable to tell if this is from the baby moving or is true pain. Denies vaginal bleeding or loss of fluid. Continues to note fetal movement. Notes occasional contractions at night, however these always resolve and space out.  See flow sheet for details.  A/P: Pregnancy at 5169w2d.    Blood pressure elevated to 129/81 with similar reading on recheck. Urinalysis with protein. Will check CBC, CMP, protein creatinine ratio. Discussed with Dr. Pollie MeyerMcIntyre who also [facility]. Will return tomorrow to follow-up on labs and recheck blood pressure. If blurred vision or headache recurs to go to [facility].  TDAP at [facility] Department. Infant feeding choice: Bottle Contraception choics: Condoms Infant circumcision desired not applicable GBS/GC/CZ results were reviewed today.   Labor precautions reviewed. Kick counts reviewed.

## 2016-04-05 NOTE — Patient Instructions (Signed)
Thank you so much for coming to visit today! Your blood pressure was elevated today. We will check several labs and contact you with the results. Please return on FRIDAY for a nurse's visit to have your blood pressure rechecked. If you develop blurred vision or headaches again please go to the MAU. If your blood pressure is elevated at Friday's visit, you may be sent to the MAU.  Dr. Caroleen Hammanumley  Tercer trimestre de Vanetta Muldersembarazo (Third Trimester of Pregnancy) El tercer trimestre comprende desde la semana29 The ServiceMaster Companyhasta la semana42, es decir, desde el mes7 hasta el [address]. El tercer trimestre es un perodo en el que el feto crece rpidamente. Hacia el final del noveno mes, el feto mide alrededor de 20pulgadas (45cm) de largo y pesa entre 6 y 10 libras (2,700 y 174,500kg).  CAMBIOS EN EL ORGANISMO Su organismo atraviesa por muchos cambios durante el Sierra Brooksembarazo, y estos varan de Neomia Dearuna mujer a Educational psychologistotra.   Seguir American Standard Companiesaumentando de peso. Es de esperar que aumente entre 25 y 35libras (11 y 16kg) hacia el final del Psychiatristembarazo.  Podrn aparecer las primeras Albertson'sestras en las caderas, el abdomen y las Scotiamamas.  Puede tener necesidad de Geographical information systems officerorinar con ms frecuencia porque el feto baja hacia la pelvis y ejerce presin sobre la vejiga.  Debido al Vanetta Muldersembarazo podr sentir Anthoney Haradaacidez estomacal con frecuencia.  Puede estar estreida, ya que ciertas hormonas enlentecen los movimientos de los msculos que New York Life Insuranceempujan los desechos a travs de los intestinos.  Pueden aparecer hemorroides o abultarse e hincharse las venas (venas varicosas).  Puede sentir dolor plvico debido al Con-wayaumento de peso y a que las hormonas del Management consultantembarazo relajan las articulaciones entre los huesos de la pelvis. El dolor de espalda puede ser consecuencia de la sobrecarga de los msculos que soportan la Irondalepostura.  Tal vez haya cambios en el cabello que pueden incluir su engrosamiento, crecimiento rpido y cambios en la textura. Adems, a algunas mujeres se les cae el cabello durante  o despus del embarazo, o tienen el cabello seco o fino. Lo ms probable es que el cabello se le normalice despus del nacimiento del beb.  Las ConAgra Foodsmamas seguirn creciendo y Development worker, communityle dolern. A veces, puede haber una secrecin amarilla de las mamas llamada calostro.  El ombligo puede salir hacia afuera.  Puede sentir que le falta el aire debido a que se expande el tero.  Puede notar que el feto "baja" o lo siente ms bajo, en el abdomen.  Puede tener una prdida de secrecin mucosa con sangre. Esto suele ocurrir en el trmino de unos [address]os das a una semana antes de que comience el [place]ville Centertrabajo de Exeterparto.  El cuello del tero se vuelve delgado y blando (se borra) cerca de la fecha de Ocklawahaparto. QU DEBE ESPERAR EN LOS EXMENES PRENATALES  Le harn exmenes prenatales cada 2semanas hasta la semana36. A partir de ese momento le harn exmenes semanales. Durante una visita prenatal de rutina:  La pesarn para asegurarse de que usted y el feto estn creciendo normalmente.  Le tomarn la presin arterial.  Le medirn el abdomen para controlar el desarrollo del beb.  Se escucharn los latidos cardacos fetales.  Se evaluarn los resultados de los estudios solicitados en visitas anteriores.  Le revisarn el cuello del tero cuando est prxima la fecha de parto para controlar si este se ha borrado. Alrededor de la semana36, el mdico le revisar el cuello del tero. Al mismo tiempo, realizar un anlisis de las secreciones del tejido vaginal. Cleda Clarksste examen es para  determinar si hay un tipo de bacteria, estreptococo Grupo B. El mdico le explicar esto con ms detalle. El mdico puede preguntarle lo siguiente:  Cmo le gustara que fuera el [place].  Cmo se siente.  Si siente los movimientos del beb.  Si ha tenido sntomas anormales, como prdida de lquido, [place], dolores de cabeza intensos o clicos abdominales.  Si est consumiendo algn producto que contenga tabaco, como cigarrillos, tabaco de  Theatre manager y Administrator, Civil Service.  Si tiene [employer]. Otros exmenes o estudios de deteccin que pueden realizarse durante el tercer trimestre incluyen lo siguiente:  Anlisis de sangre para controlar los niveles de hierro (anemia).  Controles fetales para determinar su salud, nivel de [place] y Designer, jewellery. Si tiene [place] enfermedad o hay problemas durante el embarazo, le harn estudios.  Prueba del VIH (virus de inmunodeficiencia humana). Si corre Chiropodist, pueden realizarle una prueba de deteccin del VIH durante el tercer trimestre del embarazo. FALSO TRABAJO DE PARTO Es posible que sienta contracciones leves e irregulares que finalmente desaparecen. Se llaman contracciones de [address] o falso trabajo de [place]. Las [employer] pueden durar horas, [address] o incluso semanas, antes de que el verdadero trabajo de parto se inicie. Si las contracciones ocurren a intervalos regulares, se intensifican o se hacen dolorosas, lo mejor es que la revise el mdico.  SIGNOS DE TRABAJO DE PARTO   Clicos de tipo menstrual.  Contracciones cada o menos.  Contracciones que comienzan en la parte superior del tero y se extienden hacia abajo, a la zona inferior del abdomen y la espalda.  Sensacin de mayor presin en la pelvis o dolor de espalda.  Una secrecin de mucosidad acuosa o con sangre que sale de la vagina. Si tiene alguno de estos signos antes de la semana37 del Psychiatrist, llame a su mdico de inmediato. Debe concurrir al hospital para que la controlen inmediatamente. INSTRUCCIONES PARA EL CUIDADO EN EL HOGAR   Evite fumar, consumir hierbas, beber alcohol y tomar frmacos que no le hayan recetado. Estas sustancias qumicas afectan la formacin y el desarrollo del beb.  No consuma ningn producto que contenga tabaco, lo que incluye cigarrillos, tabaco de Theatre manager y Administrator, Civil Service. Si necesita ayuda para dejar de fumar, consulte al [employer]. Puede recibir  asesoramiento y otro tipo de recursos para dejar de fumar.  Siga las indicaciones del mdico en relacin con el uso de medicamentos. Durante el embarazo, hay medicamentos que son seguros de tomar y otros que no.  Haga ejercicio solamente como se lo haya indicado el mdico. Sentir clicos uterinos es un buen signo para Restaurant manager, fast food actividad fsica.  Contine comiendo alimentos sanos con regularidad.  Use un sostn que le brinde buen soporte si le [employer].  No se d baos de inmersin en agua caliente, baos turcos ni saunas.  Use el cinturn de seguridad en todo momento mientras conduce.  No coma carne cruda ni queso sin cocinar; evite el contacto con las bandejas sanitarias de los gatos y la tierra que estos animales usan. Estos elementos contienen grmenes que pueden causar defectos congnitos en el beb.  Tome las vitaminas prenatales.  Tome entre 1500 y 2000mg  de calcio diariamente comenzando en la semana20 del embarazo [place].  Si est estreida, pruebe un laxante suave (si el mdico lo autoriza). Consuma ms alimentos ricos en fibra, como vegetales y frutas frescos y Radiation protection practitioner. Beba gran cantidad de lquido para mantener la orina de tono claro o color amarillo plido.  Dese baos de asiento con agua tibia para Engineer, materials o las molestias causadas por las hemorroides. Use una crema para las hemorroides si el mdico la autoriza.  Si tiene venas varicosas, use medias de descanso. Eleve los pies durante , 3 o 4veces por da. Limite el consumo de sal en su dieta.  Evite levantar objetos pesados, use zapatos de tacones bajos y [place].  Descanse con las piernas elevadas si tiene calambres o dolor de cintura.  Visite a su dentista si no lo ha Occupational hygienist. Use un cepillo de dientes blando para higienizarse los dientes y psese el hilo dental con suavidad.  Puede seguir [employer], a menos  que el mdico le indique lo contrario.  No haga viajes largos excepto que sea absolutamente necesario y solo con la autorizacin del mdico.  Tome clases prenatales para Financial trader, Education administrator y hacer preguntas sobre el [place] de parto y [place].  Haga un ensayo de la partida al hospital.  Prepare el bolso que llevar al hospital.  Prepare la habitacin del beb.  Concurra a todas las visitas prenatales segn las indicaciones de su mdico. SOLICITE ATENCIN MDICA SI:  No est segura de que est en trabajo de parto o de que ha roto la bolsa de las aguas.  Tiene mareos.  Siente clicos leves, presin en la pelvis o dolor persistente en el abdomen.  Tiene nuseas, vmitos o diarrea persistentes.  Brett Fairy secrecin vaginal con mal olor.  Siente dolor al ConocoPhillips. SOLICITE ATENCIN MDICA DE INMEDIATO SI:   Tiene fiebre.  Tiene una prdida de lquido por la vagina.  Tiene sangrado o pequeas prdidas vaginales.  Siente dolor intenso o clicos en el abdomen.  Sube o baja de peso rpidamente.  Tiene dificultad para respirar y siente dolor de pecho.  Sbitamente se le hinchan mucho el rostro, las [place], los tobillos, los pies o las piernas.  No ha sentido los movimientos del beb durante Georgianne Fick.  Siente un dolor de cabeza intenso que no se alivia con medicamentos.  Su visin se modifica.   Esta informacin no tiene Theme park manager el consejo del mdico. Asegrese de hacerle al mdico cualquier pregunta que tenga.   Document Released: 05/03/2005 Document Revised: 08/14/2014 Elsevier Interactive Patient Education Yahoo[employer] Inc.

## 2016-04-06 ENCOUNTER — Ambulatory Visit (INDEPENDENT_AMBULATORY_CARE_PROVIDER_SITE_OTHER): Payer: Self-pay | Admitting: Obstetrics and Gynecology

## 2016-04-06 VITALS — BP 120/80 | HR 70 | Temp 98.4°F | Wt 178.0 lb

## 2016-04-06 DIAGNOSIS — R809 Proteinuria, unspecified: Secondary | ICD-10-CM

## 2016-04-06 DIAGNOSIS — Z3A38 38 weeks gestation of pregnancy: Secondary | ICD-10-CM

## 2016-04-06 LAB — COMPREHENSIVE METABOLIC PANEL
ALBUMIN: 2.9 g/dL — AB (ref 3.6–5.1)
ALK PHOS: 153 U/L — AB (ref 33–115)
ALT: 16 U/L (ref 6–29)
AST: 18 U/L (ref 10–30)
BILIRUBIN TOTAL: 0.2 mg/dL (ref 0.2–1.2)
BUN: 13 mg/dL (ref 7–25)
CALCIUM: 8.6 mg/dL (ref 8.6–10.2)
CO2: 20 mmol/L (ref 20–31)
Chloride: 107 mmol/L (ref 98–110)
Creat: 0.65 mg/dL (ref 0.50–1.10)
Glucose, Bld: 92 mg/dL (ref 65–99)
POTASSIUM: 4.2 mmol/L (ref 3.5–5.3)
Sodium: 135 mmol/L (ref 135–146)
TOTAL PROTEIN: 5.9 g/dL — AB (ref 6.1–8.1)

## 2016-04-06 LAB — POCT URINALYSIS DIPSTICK
GLUCOSE UA: NEGATIVE
NITRITE UA: NEGATIVE
Protein, UA: >=300
Spec Grav, UA: >=1.03
Urobilinogen, UA: 1
pH, UA: 5.5

## 2016-04-06 LAB — PROTEIN / CREATININE RATIO, URINE
Creatinine, Urine: 169 mg/dL (ref 20–320)
Protein Creatinine Ratio: 1189 mg/g creat — ABNORMAL HIGH (ref 21–161)
TOTAL PROTEIN, URINE: 201 mg/dL — AB (ref 5–24)

## 2016-04-06 NOTE — Progress Notes (Signed)
HPI   Patient is a 30 year old G2 P0 at 38 weeks and 3 days who presents for follow-up evaluation for severe proteinuria. Patient presented yesterday for headaches and blurred vision through the weekend but has since become totally asymptomatic. She has not had any symptoms in 48 hours. Patient does report that she feels great at this time. She has no history of high blood pressure. Her blood pressures have been normal to the pregnancy. She also has no proteinuria through this pregnancy. Reports regular fetal movement no contractions and no vaginal bleeding. She does report possibly some minimal dysuria.  Physical exam: General: Well-appearing no acute distress Abdomen gravid soft nontender nondistended no right upper quadrant pain Vascular: Regular rate and rhythm no murmurs rubs or gallops Pulmonary: Clear to auscultation bilaterally no wheezes or rales Fetal heart tones 150s  Assessment and plan: 38 week pregnancy, severe proteinuria Normal blood pressures. Repeat UA was performed today and this time does show some mild leukoesterase. We will send for urine culture. We'll not treat at this time as patient is not overly symptomatic. We will also order a growth ultrasound with BPP to assess for growth restriction or other signs of concern. A 24-hour urine was ordered to confirm the elevated protein to creatinine ratio.  With normal blood pressures we will not induce at this time especially given that she is a prime. We'll await results of testing. Given strict precautions to come to MAU if any signs symptoms of preeclampsia develop to the weekend including but not limited to swelling headache or blurred vision

## 2016-04-06 NOTE — Patient Instructions (Signed)
Please go to maternity admission at [facility] hospital with any of the symptoms below.  Please get an [place] preformed today. We will schedule for you.  Preeclampsia and Eclampsia Preeclampsia is a serious condition that develops only during pregnancy. It is also called toxemia of pregnancy. This condition causes high blood pressure along with other symptoms, such as swelling and headaches. These may develop as the condition gets worse. Preeclampsia may occur 20 weeks or later into your pregnancy.  Diagnosing and treating preeclampsia early is very important. If not treated early, it can cause serious problems for you and your baby. One problem it can lead to is eclampsia, which is a condition that causes muscle jerking or shaking (convulsions) in the mother. Delivering your baby is the best treatment for preeclampsia or eclampsia.  RISK FACTORS The cause of preeclampsia is not known. You may be more likely to develop preeclampsia if you have certain risk factors. These include:   Being pregnant for the first time.  Having preeclampsia in a past pregnancy.  Having a family history of preeclampsia.  Having high blood pressure.  Being pregnant with twins or triplets.  Being 20 or older.  Being African American.  Having kidney disease or diabetes.  Having medical conditions such as lupus or blood diseases.  Being very overweight (obese). SIGNS AND SYMPTOMS  The earliest signs of preeclampsia are:  High blood pressure.  Increased protein in your urine. Your health care provider will check for this at every prenatal visit. Other symptoms that can develop include:   Severe headaches.  Sudden weight gain.  Swelling of your hands, face, legs, and feet.  Feeling sick to your stomach (nauseous) and throwing up (vomiting).  Vision problems (blurred or double vision).  Numbness in your face, arms, legs, and feet.  Dizziness.  Slurred speech.  Sensitivity to bright  lights.  Abdominal pain. DIAGNOSIS  There are no screening tests for preeclampsia. Your health care provider will ask you about symptoms and check for signs of preeclampsia during your prenatal visits. You may also have tests, including:  Urine testing.  Blood testing.  Checking your baby's heart rate.  Checking the health of your baby and your placenta using images created with sound waves (ultrasound). TREATMENT  You can work out the best treatment approach together with your health care provider. It is very important to keep all prenatal appointments. If you have an increased risk of preeclampsia, you may need more frequent prenatal exams.  Your health care provider may prescribe bed rest.  You may have to eat as little salt as possible.  You may need to take medicine to lower your blood pressure if the condition does not respond to more conservative measures.  You may need to stay in the hospital if your condition is severe. There, treatment will focus on controlling your blood pressure and fluid retention. You may also need to take medicine to prevent seizures.  If the condition gets worse, your baby may need to be delivered early to protect you and the baby. You may have your labor started with medicine (be induced), or you may have a cesarean delivery.  Preeclampsia usually goes away after the baby is born. HOME CARE INSTRUCTIONS   Only take over-the-counter or prescription medicines as directed by your health care provider.  Lie on your left side while resting. This keeps pressure off your baby.  Elevate your feet while resting.  Get regular exercise. Ask your health care provider what type of  exercise is safe for you.  Avoid caffeine and alcohol.  Do not smoke.  Drink 6-8 glasses of water every day.  Eat a balanced diet that is low in salt. Do not add salt to your food.  Avoid stressful situations as much as possible.  Get plenty of rest and sleep.  Keep all  prenatal appointments and tests as scheduled. SEEK MEDICAL CARE IF:  You are gaining more weight than expected.  You have any headaches, abdominal pain, or nausea.  You are bruising more than usual.  You feel dizzy or light-headed. SEEK IMMEDIATE MEDICAL CARE IF:   You develop sudden or severe swelling anywhere in your body. This usually happens in the legs.  You gain 5 lb (2.3 kg) or more in a week.  You have a severe headache, dizziness, problems with your vision, or confusion.  You have severe abdominal pain.  You have lasting nausea or vomiting.  You have a seizure.  You have trouble moving any part of your body.  You develop numbness in your body.  You have trouble speaking.  You have any abnormal bleeding.  You develop a stiff neck.  You pass out. MAKE SURE YOU:   Understand these instructions.  Will watch your condition.  Will get help right away if you are not doing well or get worse.   This information is not intended to replace advice given to you by your health care provider. Make sure you discuss any questions you have with your health care provider.   Document Released: 07/21/2000 Document Revised: 07/29/2013 Document Reviewed: 05/16/2013 Elsevier Interactive Patient Education [employer] Inc2016 Elsevier Inc.

## 2016-04-07 ENCOUNTER — Ambulatory Visit (HOSPITAL_COMMUNITY)
Admission: RE | Admit: 2016-04-07 | Discharge: 2016-04-07 | Disposition: A | Discharge status: Home / Self Care | Payer: BLUE CROSS/BLUE SHIELD | Source: Ambulatory Visit | Attending: Obstetrics and Gynecology | Admitting: Obstetrics and Gynecology

## 2016-04-07 ENCOUNTER — Other Ambulatory Visit: Payer: Self-pay | Admitting: Obstetrics and Gynecology

## 2016-04-07 ENCOUNTER — Encounter (HOSPITAL_COMMUNITY): Payer: Self-pay

## 2016-04-07 DIAGNOSIS — R809 Proteinuria, unspecified: Secondary | ICD-10-CM

## 2016-04-07 DIAGNOSIS — O1210 Gestational proteinuria, unspecified trimester: Secondary | ICD-10-CM

## 2016-04-07 DIAGNOSIS — Z3A38 38 weeks gestation of pregnancy: Secondary | ICD-10-CM | POA: Diagnosis not present

## 2016-04-07 DIAGNOSIS — O1213 Gestational proteinuria, third trimester: Secondary | ICD-10-CM | POA: Diagnosis not present

## 2016-04-07 NOTE — Addendum Note (Signed)
Addended by: Jennette BillBUSICK, ROBERT L on: 04/07/2016 02:22 PM   Modules accepted: Orders

## 2016-04-08 LAB — CREATININE, URINE, 24 HOUR
Creatinine, 24H Ur: 1.22 g/(24.h) (ref 0.63–2.50)
Creatinine, Urine: 72 mg/dL (ref 20–320)

## 2016-04-09 LAB — CULTURE, OB URINE: Organism ID, Bacteria: 10000

## 2016-04-11 ENCOUNTER — Ambulatory Visit (INDEPENDENT_AMBULATORY_CARE_PROVIDER_SITE_OTHER): Payer: Self-pay | Admitting: Family Medicine

## 2016-04-11 VITALS — BP 132/88 | HR 60 | Temp 98.1°F | Wt 179.2 lb

## 2016-04-11 DIAGNOSIS — Z3483 Encounter for supervision of other normal pregnancy, third trimester: Secondary | ICD-10-CM

## 2016-04-11 DIAGNOSIS — R030 Elevated blood-pressure reading, without diagnosis of hypertension: Secondary | ICD-10-CM | POA: Insufficient documentation

## 2016-04-11 NOTE — Patient Instructions (Signed)
Preeclampsia and Eclampsia Preeclampsia is a serious condition that develops only during pregnancy. It is also called toxemia of pregnancy. This condition causes high blood pressure along with other symptoms, such as swelling and headaches. These may develop as the condition gets worse. Preeclampsia may occur 20 weeks or later into your pregnancy.  Diagnosing and treating preeclampsia early is very important. If not treated early, it can cause serious problems for you and your baby. One problem it can lead to is eclampsia, which is a condition that causes muscle jerking or shaking (convulsions) in the mother. Delivering your baby is the best treatment for preeclampsia or eclampsia.  RISK FACTORS The cause of preeclampsia is not known. You may be more likely to develop preeclampsia if you have certain risk factors. These include:   Being pregnant for the first time.  Having preeclampsia in a past pregnancy.  Having a family history of preeclampsia.  Having high blood pressure.  Being pregnant with twins or triplets.  Being 135 or older.  Being African American.  Having kidney disease or diabetes.  Having medical conditions such as lupus or blood diseases.  Being very overweight (obese). SIGNS AND SYMPTOMS  The earliest signs of preeclampsia are:  High blood pressure.  Increased protein in your urine. Your health care provider will check for this at every prenatal visit. Other symptoms that can develop include:   Severe headaches.  Sudden weight gain.  Swelling of your hands, face, legs, and feet.  Feeling sick to your stomach (nauseous) and throwing up (vomiting).  Vision problems (blurred or double vision).  Numbness in your face, arms, legs, and feet.  Dizziness.  Slurred speech.  Sensitivity to bright lights.  Abdominal pain. DIAGNOSIS  There are no screening tests for preeclampsia. Your health care provider will ask you about symptoms and check for signs of  preeclampsia during your prenatal visits. You may also have tests, including:  Urine testing.  Blood testing.  Checking your baby's heart rate.  Checking the health of your baby and your placenta using images created with sound waves (ultrasound). TREATMENT  You can work out the best treatment approach together with your health care provider. It is very important to keep all prenatal appointments. If you have an increased risk of preeclampsia, you may need more frequent prenatal exams.  Your health care provider may prescribe bed rest.  You may have to eat as little salt as possible.  You may need to take medicine to lower your blood pressure if the condition does not respond to more conservative measures.  You may need to stay in the hospital if your condition is severe. There, treatment will focus on controlling your blood pressure and fluid retention. You may also need to take medicine to prevent seizures.  If the condition gets worse, your baby may need to be delivered early to protect you and the baby. You may have your labor started with medicine (be induced), or you may have a cesarean delivery.  Preeclampsia usually goes away after the baby is born. HOME CARE INSTRUCTIONS   Only take over-the-counter or prescription medicines as directed by your health care provider.  Lie on your left side while resting. This keeps pressure off your baby.  Elevate your feet while resting.  Get regular exercise. Ask your health care provider what type of exercise is safe for you.  Avoid caffeine and alcohol.  Do not smoke.  Drink 6-8 glasses of water every day.  Eat a balanced diet  that is low in salt. Do not add salt to your food.  Avoid stressful situations as much as possible.  Get plenty of rest and sleep.  Keep all prenatal appointments and tests as scheduled. SEEK MEDICAL CARE IF:  You are gaining more weight than expected.  You have any headaches, abdominal pain, or  nausea.  You are bruising more than usual.  You feel dizzy or light-headed. SEEK IMMEDIATE MEDICAL CARE IF:   You develop sudden or severe swelling anywhere in your body. This usually happens in the legs.  You gain 5 lb (2.3 kg) or more in a week.  You have a severe headache, dizziness, problems with your vision, or confusion.  You have severe abdominal pain.  You have lasting nausea or vomiting.  You have a seizure.  You have trouble moving any part of your body.  You develop numbness in your body.  You have trouble speaking.  You have any abnormal bleeding.  You develop a stiff neck.  You pass out. MAKE SURE YOU:   Understand these instructions.  Will watch your condition.  Will get help right away if you are not doing well or get worse.   This information is not intended to replace advice given to you by your health care provider. Make sure you discuss any questions you have with your health care provider.   Document Released: 07/21/2000 Document Revised: 07/29/2013 Document Reviewed: 05/16/2013 Elsevier Interactive Patient Education [employer]2016 [employer] Inc[employer]   Third Trimester of Pregnancy The third trimester is from week 29 through week 42, months 7 through 9. This trimester is when your unborn baby (fetus) is growing very fast. At the end of the ninth month, the unborn baby is about 20 inches in length. It weighs about 6-10 pounds.  HOME CARE   Avoid all smoking, herbs, and alcohol. Avoid drugs not approved by your doctor.  Do not use any tobacco products, including cigarettes, chewing tobacco, and electronic cigarettes. If you need help quitting, ask your doctor. You may get counseling or other support to help you quit.  Only take medicine as told by your doctor. Some medicines are safe and some are not during pregnancy.  Exercise only as told by your doctor. Stop exercising if you start having cramps.  Eat regular, healthy meals.  Wear a good support bra if  your breasts are tender.  Do not use hot tubs, steam rooms, or saunas.  Wear your seat belt when driving.  Avoid raw meat, uncooked cheese, and liter boxes and soil used by cats.  Take your prenatal vitamins.  Take 1500-2000 milligrams of calcium daily starting at the 20th week of pregnancy until you deliver your baby.  Try taking medicine that helps you poop (stool softener) as needed, and if your doctor approves. Eat more fiber by eating fresh fruit, vegetables, and whole grains. Drink enough fluids to keep your pee (urine) clear or pale yellow.  Take warm water baths (sitz baths) to soothe pain or discomfort caused by hemorrhoids. Use hemorrhoid cream if your doctor approves.  If you have puffy, bulging veins (varicose veins), wear support hose. Raise (elevate) your feet for 15 minutes, 3-4 times a day. Limit salt in your diet.  Avoid heavy lifting, wear low heels, and sit up straight.  Rest with your legs raised if you have leg cramps or low back pain.  Visit your dentist if you have not gone during your pregnancy. Use a soft toothbrush to brush your teeth. Be gentle  when you floss.  You can have sex (intercourse) unless your doctor tells you not to.  Do not travel far distances unless you must. Only do so with your doctor's approval.  Take prenatal classes.  Practice driving to the hospital.  Pack your hospital bag.  Prepare the baby's room.  Go to your doctor visits. GET HELP IF:  You are not sure if you are in labor or if your water has broken.  You are dizzy.  You have mild cramps or pressure in your lower belly (abdominal).  You have a nagging pain in your belly area.  You continue to feel sick to your stomach (nauseous), throw up (vomit), or have watery poop (diarrhea).  You have bad smelling fluid coming from your vagina.  You have pain with peeing (urination). GET HELP RIGHT AWAY IF:   You have a fever.  You are leaking fluid from your  vagina.  You are spotting or bleeding from your vagina.  You have severe belly cramping or pain.  You lose or gain weight rapidly.  You have trouble catching your breath and have chest pain.  You notice sudden or extreme puffiness (swelling) of your face, hands, ankles, feet, or legs.  You have not felt the baby move in over an hour.  You have severe headaches that do not go away with medicine.  You have vision changes.   This information is not intended to replace advice given to you by your health care provider. Make sure you discuss any questions you have with your health care provider.   Document Released: 10/18/2009 Document Revised: 08/14/2014 Document Reviewed: 09/24/2012 Elsevier Interactive Patient Education [employer]

## 2016-04-11 NOTE — Progress Notes (Signed)
Subjective:  Krystal OliphantLissette Spengler is a 30 y.o. G2P0010 at 8583w1d being seen today for ongoing prenatal care.  She is currently monitored for the following issues for this low-risk pregnancy and has Rash and nonspecific skin eruption; Supervision of normal first pregnancy; Nausea and vomiting during pregnancy prior to [redacted] weeks gestation; Encounter for supervision of other normal pregnancy; and Pruritic rash on her problem list.  Patient reports vaginal pressure, edema. Last week she was having blurry vision and headaches, these have resolved. Patient is asymptomatic today, only has lower extremity edema, denies HAs, blurred vision, RUQ/epigastric pain. Good fetal movement. Denies leaking of fluid. Denies VB.  The following portions of the patient's history were reviewed and updated as appropriate: allergies, current medications, past family history, past medical history, past social history, past surgical history and problem list. Problem list updated.  Objective:   Vitals:   04/11/16 1333 04/11/16 1338  BP: (!) 137/94 132/88  Pulse: 60   Temp: 98.1 F (36.7 C)   Weight: 179 lb 3.2 oz (81.3 kg)     Fetal Status:   Fundal Height: 38 cm       General:  Alert, oriented and cooperative. Patient is in no acute distress.  Skin: Skin is warm and dry. No rash noted.   Cardiovascular: Normal heart rate noted  Respiratory: Normal respiratory effort, no problems with respiration noted  Abdomen: Soft, gravid, appropriate for gestational age.       Pelvic:  Cervical exam performed       Closed/50 (medium)/-3; Cephalic  Extremities: Normal range of motion.     Mental Status: Normal mood and affect. Normal behavior. Normal judgment and thought content.   Urinalysis:      Assessment and Plan:  Pregnancy: G2P0010 at 6683w1d  1. Encounter for supervision of other normal pregnancy in third trimester - GBS neg - see below  2. Elevated blood pressure reading without diagnosis of hypertension - Patient  is to return to clinic tomorrow AM at [facility] for repeat BP check. If elevated, to be sent to MAU for preE workup and for induction (likely developing GHTN).  - Reviewed signs/symptoms of preeclampsia with patient, who agrees to go to MAU immediately if any symptoms begin and she has elevated BPs at home (has home BP cuff).  There are no diagnoses linked to this encounter. Term labor symptoms and general obstetric precautions including but not limited to vaginal bleeding, contractions, leaking of fluid and fetal movement were reviewed in detail with the patient. Please refer to After Visit Summary for other counseling recommendations.  Return in about 1 day (around 04/12/2016) for return tomorrow for BP recheck; then 1 week for routine OB.   [facility] WilliamsonElizabeth Woodland BateslandMumaw, [place]DO

## 2016-04-12 ENCOUNTER — Encounter (HOSPITAL_COMMUNITY): Payer: Self-pay

## 2016-04-12 ENCOUNTER — Inpatient Hospital Stay (HOSPITAL_COMMUNITY)
Admission: AD | Admit: 2016-04-12 | Discharge: 2016-04-16 | DRG: 766 | Disposition: A | Discharge status: Home / Self Care | Payer: BLUE CROSS/BLUE SHIELD | Source: Ambulatory Visit | Attending: Obstetrics & Gynecology | Admitting: Obstetrics & Gynecology

## 2016-04-12 ENCOUNTER — Ambulatory Visit (INDEPENDENT_AMBULATORY_CARE_PROVIDER_SITE_OTHER): Payer: BLUE CROSS/BLUE SHIELD | Admitting: *Deleted

## 2016-04-12 VITALS — BP 130/94 | HR 81 | Wt 179.0 lb

## 2016-04-12 DIAGNOSIS — Z833 Family history of diabetes mellitus: Secondary | ICD-10-CM | POA: Diagnosis not present

## 2016-04-12 DIAGNOSIS — O339 Maternal care for disproportion, unspecified: Secondary | ICD-10-CM | POA: Diagnosis present

## 2016-04-12 DIAGNOSIS — R030 Elevated blood-pressure reading, without diagnosis of hypertension: Secondary | ICD-10-CM

## 2016-04-12 DIAGNOSIS — O324XX0 Maternal care for high head at term, not applicable or unspecified: Secondary | ICD-10-CM | POA: Diagnosis present

## 2016-04-12 DIAGNOSIS — O1414 Severe pre-eclampsia complicating childbirth: Secondary | ICD-10-CM | POA: Diagnosis present

## 2016-04-12 DIAGNOSIS — O139 Gestational [pregnancy-induced] hypertension without significant proteinuria, unspecified trimester: Secondary | ICD-10-CM

## 2016-04-12 DIAGNOSIS — O134 Gestational [pregnancy-induced] hypertension without significant proteinuria, complicating childbirth: Secondary | ICD-10-CM | POA: Diagnosis not present

## 2016-04-12 DIAGNOSIS — Z3A39 39 weeks gestation of pregnancy: Secondary | ICD-10-CM

## 2016-04-12 LAB — URINE MICROSCOPIC-ADD ON

## 2016-04-12 LAB — COMPREHENSIVE METABOLIC PANEL
ALT: 19 U/L (ref 14–54)
AST: 23 U/L (ref 15–41)
Albumin: 2.8 g/dL — ABNORMAL LOW (ref 3.5–5.0)
Alkaline Phosphatase: 154 U/L — ABNORMAL HIGH (ref 38–126)
Anion gap: 5 (ref 5–15)
BUN: 12 mg/dL (ref 6–20)
CHLORIDE: 107 mmol/L (ref 101–111)
CO2: 21 mmol/L — AB (ref 22–32)
CREATININE: 0.64 mg/dL (ref 0.44–1.00)
Calcium: 8.3 mg/dL — ABNORMAL LOW (ref 8.9–10.3)
GFR calc Af Amer: >60 mL/min (ref >60)
GLUCOSE: 78 mg/dL (ref 65–99)
Potassium: 4 mmol/L (ref 3.5–5.1)
Sodium: 133 mmol/L — ABNORMAL LOW (ref 135–145)
Total Bilirubin: 0.3 mg/dL (ref 0.3–1.2)
Total Protein: 6.4 g/dL — ABNORMAL LOW (ref 6.5–8.1)

## 2016-04-12 LAB — URINALYSIS, ROUTINE W REFLEX MICROSCOPIC
BILIRUBIN URINE: NEGATIVE
Glucose, UA: NEGATIVE mg/dL
Ketones, ur: NEGATIVE mg/dL
NITRITE: NEGATIVE
Protein, ur: 100 mg/dL — AB
SPECIFIC GRAVITY, URINE: 1.02 (ref 1.005–1.030)
pH: 6 (ref 5.0–8.0)

## 2016-04-12 LAB — CBC
HCT: 34.4 % — ABNORMAL LOW (ref 36.0–46.0)
HCT: 36.1 % (ref 36.0–46.0)
Hemoglobin: 11.8 g/dL — ABNORMAL LOW (ref 12.0–15.0)
Hemoglobin: 12.3 g/dL (ref 12.0–15.0)
MCH: 26.2 pg (ref 26.0–34.0)
MCH: 26.3 pg (ref 26.0–34.0)
MCHC: 34.3 g/dL (ref 30.0–36.0)
MCHC: 34.1 g/dL (ref 30.0–36.0)
MCV: 76.4 fL — AB (ref 78.0–100.0)
MCV: 77.1 fL — AB (ref 78.0–100.0)
PLATELETS: 171 10*3/uL (ref 150–400)
PLATELETS: 176 10*3/uL (ref 150–400)
RBC: 4.5 MIL/uL (ref 3.87–5.11)
RBC: 4.68 MIL/uL (ref 3.87–5.11)
RDW: 14.5 % (ref 11.5–15.5)
RDW: 14.6 % (ref 11.5–15.5)
WBC: 7.5 10*3/uL (ref 4.0–10.5)
WBC: 8.5 10*3/uL (ref 4.0–10.5)

## 2016-04-12 LAB — PROTEIN / CREATININE RATIO, URINE
CREATININE, URINE: 167 mg/dL
Protein Creatinine Ratio: 1.26 mg/mg{Cre} — ABNORMAL HIGH (ref 0.00–0.15)
Total Protein, Urine: 210 mg/dL

## 2016-04-12 LAB — TYPE AND SCREEN
ABO/RH(D): O POS
ANTIBODY SCREEN: NEGATIVE

## 2016-04-12 MED ORDER — TERBUTALINE SULFATE 1 MG/ML IJ SOLN: 0.2500 mg | Freq: Once | INTRAMUSCULAR | Status: DC | PRN

## 2016-04-12 MED ORDER — OXYTOCIN BOLUS FROM INFUSION: 500.0000 mL | Freq: Once | INTRAVENOUS | Status: DC

## 2016-04-12 MED ORDER — LIDOCAINE HCL (PF) 1 % IJ SOLN
30.0000 mL | INTRAMUSCULAR | Status: DC | PRN
  Filled 2016-04-12: qty 30

## 2016-04-12 MED ORDER — ONDANSETRON HCL 4 MG/2ML IJ SOLN: 4.0000 mg | Freq: Four times a day (QID) | INTRAMUSCULAR | Status: DC | PRN

## 2016-04-12 MED ORDER — LABETALOL HCL 5 MG/ML IV SOLN
20.0000 mg | INTRAVENOUS | Status: DC | PRN
Start: 2016-04-12 — End: 2016-04-14
  Administered 2016-04-12 (×2): 20 mg via INTRAVENOUS
  Filled 2016-04-12 (×2): qty 4

## 2016-04-12 MED ORDER — MAGNESIUM SULFATE BOLUS VIA INFUSION
4.0000 g | Freq: Once | INTRAVENOUS | Status: AC
  Administered 2016-04-12: 4 g via INTRAVENOUS
  Filled 2016-04-12: qty 500

## 2016-04-12 MED ORDER — LACTATED RINGERS IV SOLN: 500.0000 mL | INTRAVENOUS | Status: DC | PRN

## 2016-04-12 MED ORDER — SOD CITRATE-CITRIC ACID 500-334 MG/5ML PO SOLN
30.0000 mL | ORAL | Status: DC | PRN
  Administered 2016-04-14: 30 mL via ORAL
  Filled 2016-04-12: qty 15

## 2016-04-12 MED ORDER — MAGNESIUM SULFATE 50 % IJ SOLN
2.0000 g/h | INTRAVENOUS | Status: DC
  Administered 2016-04-13: 2 g/h via INTRAVENOUS
  Filled 2016-04-12 (×2): qty 80

## 2016-04-12 MED ORDER — ACETAMINOPHEN 325 MG PO TABS
650.0000 mg | ORAL_TABLET | ORAL | Status: DC | PRN
  Administered 2016-04-13: 650 mg via ORAL
  Filled 2016-04-12: qty 2

## 2016-04-12 MED ORDER — OXYTOCIN 40 UNITS IN LACTATED RINGERS INFUSION - SIMPLE MED
2.5000 [IU]/h | INTRAVENOUS | Status: DC
  Filled 2016-04-12: qty 1000

## 2016-04-12 MED ORDER — MISOPROSTOL 25 MCG QUARTER TABLET
25.0000 ug | ORAL_TABLET | ORAL | Status: DC | PRN
  Administered 2016-04-12 (×2): 25 ug via VAGINAL
  Filled 2016-04-12 (×3): qty 0.25

## 2016-04-12 MED ORDER — FLEET ENEMA 7-19 GM/118ML RE ENEM: 1.0000 | ENEMA | RECTAL | Status: DC | PRN

## 2016-04-12 MED ORDER — LACTATED RINGERS IV SOLN
INTRAVENOUS | Status: DC
  Administered 2016-04-12: 125 mL/h via INTRAVENOUS
  Administered 2016-04-13: 05:00:00 via INTRAVENOUS
  Administered 2016-04-13: 88 mL/h via INTRAVENOUS

## 2016-04-12 MED ORDER — HYDRALAZINE HCL 20 MG/ML IJ SOLN
10.0000 mg | Freq: Once | INTRAMUSCULAR | Status: AC | PRN
  Administered 2016-04-12: 10 mg via INTRAVENOUS
  Filled 2016-04-12: qty 1

## 2016-04-12 NOTE — Progress Notes (Signed)
Patient seen yesterday by Dr. Omer JackMumaw at [redacted] Weeks [place]. Her diastolic BP was elevated otherwise she was completely asymptomatic. Today she endorsed headache when she woke up in the morning, this has improved some but still ongoing. Denies change in vision, she does have B/L non-pitting edema.  BP check by the nurse: 130/94 and 134/92  I called and discuss patient with Dr. Claudean SeverancePicking to alert him about sending her to MAU for induction of Labor. Patient appears clinically stable otherwise, she drove herself to the clinic, [facility] transfer recommended but she stated she will have someone come pick her up to take her to the ED. We will monitor while she is here, otherwise she is clinically stable.

## 2016-04-12 NOTE — Progress Notes (Signed)
   Patient in nurse clinic for blood pressure check.  Patient is 8282w2d pregnant.  Patient denies chest pain, SOB, dizziness, visual changes, no vaginal bleeding or contractions at this time.  Patient reported having a "some" headache due to not sleeping well last night.  Patient has bilateral ankle and feet swelling 1-2+ pitting edema.

## 2016-04-12 NOTE — Progress Notes (Signed)
Krystal Dixon is a 30 y.o. G2P0010 with IUP at 7364w2d admitted for IOL d/t gestational HTN.  Now dx with preeclampsia with severe features.   Subjective: The pt is sitting up comfortably in bed and states that she is now feeling contractions but that they are not intense.   Objective: BP (!) 148/95   Pulse 77   Temp 98.1 F (36.7 C) (Oral)   Resp 16   Ht 5\' 3"  (1.6 m)   Wt 81.2 kg (179 lb)   LMP 06/14/2015 (Approximate)   BMI 31.71 kg/m  I/O last 3 completed shifts: In: 675 [P.O.:240; I.V.:435] Out: 350 [Urine:350] Total I/O In: 379.2 [P.O.:120; I.V.:259.2] Out: -   FHT:  FHR: baseline 130 bpm, variability: moderate,  accelerations:  Present,  decelerations:  Absent and    Category 1 tracing  UC:   regular, every 2-3 minutes  Labs: Lab Results  Component Value Date   WBC 8.5 04/12/2016   HGB 12.3 04/12/2016   HCT 36.1 04/12/2016   MCV 77.1 (L) 04/12/2016   PLT 176 04/12/2016    Assessment / Plan: 30 y.o. G2P0010 at 2564w2d here Induction of labor due to preeclampsia and with severe features,  progressing well on pitocin  Labor: contractions on toco and now feeling contractions  Preeclampsia w/ severe featurs: on mag w/o signs/symptoms of toxicity; severe range BP earlier treated. Now just mild-rage pressures Fetal Wellbeing:  Category I Pain Control:  can have epidural when in active labor Anticipated MOD:  SVD  Ming Mcmannis 04/12/2016, 9:58 PM

## 2016-04-12 NOTE — Anesthesia Pain Management Evaluation Note (Signed)
  CRNA Pain Management Visit Note  Patient: Krystal Dixon, 30 y.o., female  "Hello [facility] am a [facility] the [facility]. We have an anesthesia team available at all times to provide care throughout the hospital, including epidural management and anesthesia for C-section. I don't know your plan for the delivery whether it a natural birth, water birth, IV sedation, nitrous supplementation, doula or epidural, but we want to meet your pain goals."   1.Was your pain managed to your expectations on prior hospitalizations?   No prior hospitalizations  2.What is your expectation for pain management during this hospitalization?     Epidural  3.How can we help you reach that goal? Epidural, when ready.  Record the patient's initial score and the patient's pain goal.   Pain: 0  Pain Goal: 6 [facility] wants you to be able to say your pain was always managed very well.  Sandon Yoho L 04/12/2016

## 2016-04-12 NOTE — MAU Note (Signed)
Sent from office.  BP up yesterday and today.  Had a slight headache earlier, none now, denies visual changes and epigastric pain.  Reports increased swelling in feet.

## 2016-04-12 NOTE — MAU Note (Signed)
Urine sent to lab 

## 2016-04-12 NOTE — H&P (Signed)
LABOR AND DELIVERY ADMISSION HISTORY AND PHYSICAL NOTE  Marcel Sorter is a 30 y.o. female G2P0010 with IUP at [redacted]w[redacted]d by 10-week ultrasound, being admitted for IOL due to gestational HTN. Patient was seen for regular PNV yesterday in noted to have BP of 137/94, asymptomatic. She was seen for follow up again today, and BP still elevated to 130/94. She was sent to MAU for further evaluation, where BP is in the 140s/90. Patient denies any complaints at this time. Only endorses a mild headache earlier this morning, which resolved without any intervention. Denies any visual disturbances or RUQ. Has had some dependent edema, but denies any swelling of hands or face.   Of note, she was seen a week ago in clinic, at which time she was having severe headache. She was noted to have significant proteinuria, but no elevated BP. 24-hr urine collected at that time, which showed 1.22g protein/24h. HELLP labs negative at tha time.   She reports positive fetal movement. She denies leakage of fluid, vaginal bleeding, or regular contractions  Prenatal History/Complications: - Nausea and vomiting of pregnancy prior to [redacted] weeks gestation - Rash/nonspecific skin eruption  Past Medical History: Past Medical History:  Diagnosis Date  . Medical history non-contributory     Past Surgical History: Past Surgical History:  Procedure Laterality Date  . NO PAST SURGERIES    . WISDOM TOOTH EXTRACTION      Obstetrical History: OB History    Gravida Para Term Preterm AB Living   2 0 0 0 1 0   SAB TAB Ectopic Multiple Live Births   1 0 0 0        Social History: Social History   Social History  . Marital status: Married    Spouse name: N/A  . Number of children: N/A  . Years of education: N/A   Social History Main Topics  . Smoking status: Never Smoker  . Smokeless tobacco: Never Used  . Alcohol use No  . Drug use: No  . Sexual activity: Yes   Other Topics Concern  . None   Social History  Narrative  . None    Family History: Family History  Problem Relation Age of Onset  . Diabetes Paternal Grandfather   . Diabetes Paternal Uncle     Allergies: No Known Allergies  Prescriptions Prior to Admission  Medication Sig Dispense Refill Last Dose  . Prenatal Multivit-Min-Fe-FA (PRENATAL VITAMINS) 0.8 MG tablet Take 1 tablet by mouth daily.   Past Week at Unknown time     Review of Systems  All systems reviewed and negative except as stated in HPI  Blood pressure 129/93, pulse 65, temperature 97.8 F (36.6 C), temperature source Oral, resp. rate 18, last menstrual period 06/14/2015. General appearance: alert, cooperative and no distress Lungs: Clear to auscultation bilaterally Heart: Regular rate and rhythm Abdomen: Soft, non-tender; No RUQ or epigastric TTP Extremities:  Trace dedema Presentation: cephalic Fetal monitoring: baseline 125, moderate variability, +acel, no decel Uterine activity: no contractions noted   SVE: closed/50%/-3, cephalic per Dr. Omer Jack in office yesterday Will recheck once on BS floor  Prenatal labs: ABO, Rh: O/POS/-- (02/02 1352) Antibody: NEG (02/02 1352) Rubella: immune RPR: NON REAC (06/22 0918)  HBsAg: NEGATIVE (02/02 1352)  HIV: NONREACTIVE (06/22 0918)  GBS: NOT DETECTED (08/15 1032)  1 hr Glucola: 112 Genetic screening:  n/a Anatomy US: normal  Prenatal Transfer Tool  Maternal Diabetes: No Genetic Screening: Declined Maternal Ultrasounds/Referrals: Normal Fetal Ultrasounds or other Referrals:  None  Maternal Substance Abuse:  No Significant Maternal Medications:  None Significant Maternal Lab Results: None   Patient Active Problem List   Diagnosis Date Noted  . Elevated blood pressure reading without diagnosis of hypertension 04/11/2016  . Pruritic rash 02/18/2016  . Encounter for supervision of other normal pregnancy 01/27/2016  . Supervision of normal first pregnancy 09/30/2015  . Nausea and vomiting during  pregnancy prior to [redacted] weeks gestation 09/30/2015  . Rash and nonspecific skin eruption 04/14/2013    Assessment: Sharlynn OliphantLissette Haggar is a 30 y.o. G2P0010 with IUP at 2350w2d here with gestational hypertension at term. - Admit to L&D for IOL  - Check HELLP labs, urine Pr:Cr - Monitor BPs and for signs/symptoms of preeclampsia  #Labor: Will start cervical ripening with misoprostol #Pain: May have Epidural when in active labor #FWB: Category I #ID: GBS negative #MOF: breast and bottle #MOC: undecided #Circ:  N/a, girl  Kandra NicolasJulie P Degele 04/12/2016, 12:09 PM   OB FELLOW HISTORY AND PHYSICAL ATTESTATION  I have seen and examined this patient; I agree with above documentation in the resident's note.    Jen MowElizabeth Duston Smolenski, DO [place] Fellow 04/12/2016

## 2016-04-12 NOTE — Progress Notes (Signed)
LABOR PROGRESS NOTE  Krystal Dixon is a 30 y.o. G2P0010 at 3629w2d  admitted for IOL 2/2 preE with severe features.  Subjective: Doing well, feeling mild contractions regularly but comfortable  Objective: BP (!) 139/97   Pulse 82   Temp 98.1 F (36.7 C) (Oral)   Resp 16   Ht 5\' 3"  (1.6 m)   Wt 81.2 kg (179 lb)   LMP 06/14/2015 (Approximate)   BMI 31.71 kg/m  or  Vitals:   04/12/16 2000 04/12/16 2030 04/12/16 2100 04/12/16 2200  BP: (!) 142/91 (!) 148/95 (!) 145/99 (!) 139/97  Pulse: 80 77 85 82  Resp: 18  16   Temp:      TempSrc:      Weight:      Height:         Dilation: Fingertip Effacement (%): 50 Cervical Position: Posterior Station: -3 Presentation: Vertex Exam by:: Dr.J. Degele FHT: 130 baseline, moderate variability, +accelerations, no decelerations Uterine activity: q2-23min  Labs: Lab Results  Component Value Date   WBC 8.5 04/12/2016   HGB 12.3 04/12/2016   HCT 36.1 04/12/2016   MCV 77.1 (L) 04/12/2016   PLT 176 04/12/2016    Patient Active Problem List   Diagnosis Date Noted  . Gestational hypertension 04/12/2016  . Elevated blood pressure reading without diagnosis of hypertension 04/11/2016  . Pruritic rash 02/18/2016  . Encounter for supervision of other normal pregnancy 01/27/2016  . Supervision of normal first pregnancy 09/30/2015  . Nausea and vomiting during pregnancy prior to [redacted] weeks gestation 09/30/2015  . Rash and nonspecific skin eruption 04/14/2013    Assessment / Plan: 30 y.o. G2P0010 at 5329w2d here for IOL for preE with severe features   Labor: cytotec #2 placed @1937  Fetal Wellbeing:  Category I Pain Control:  Epidural when in active labor Anticipated MOD:  SVD  Krystal HerElsia J Armenia Silveria, DO PGY-1 9/6/201710:27 PM

## 2016-04-12 NOTE — Progress Notes (Addendum)
LABOR PROGRESS NOTE  Krystal Dixon is a 30 y.o. G2P0010 with IUP at 4285w2d admitted for IOL d/t gestational HTN. Now dx with preeclampsia with severe features.   Subjective: Patient without concerns. Not feeling cnotractions  Objective: BP 137/88   Pulse 80   Temp 97.9 F (36.6 C) (Oral)   Resp 16   Ht 5\' 3"  (1.6 m)   Wt 179 lb (81.2 kg)   LMP 06/14/2015 (Approximate)   BMI 31.71 kg/m  or  Vitals:   04/12/16 1800 04/12/16 1830 04/12/16 1900 04/12/16 1932  BP: (!) 141/90 (!) 138/91 (!) 139/95 137/88  Pulse: 77 79 88 80  Resp: 18 20 18 16   Temp:      TempSrc:      Weight:      Height:        NAD. Normocephalic  Dilation: Fingertip Effacement (%): 50 Cervical Position: Posterior Station: -3 Presentation: Vertex Exam by:: Dr.J. Degele  Toco: ctx q 2-3 min FHT: baseline 135, moderate variability, + acel, occasional variable  Labs: Lab Results  Component Value Date   WBC 8.5 04/12/2016   HGB 12.3 04/12/2016   HCT 36.1 04/12/2016   MCV 77.1 (L) 04/12/2016   PLT 176 04/12/2016    Patient Active Problem List   Diagnosis Date Noted  . Gestational hypertension 04/12/2016  . Elevated blood pressure reading without diagnosis of hypertension 04/11/2016  . Pruritic rash 02/18/2016  . Encounter for supervision of other normal pregnancy 01/27/2016  . Supervision of normal first pregnancy 09/30/2015  . Nausea and vomiting during pregnancy prior to [redacted] weeks gestation 09/30/2015  . Rash and nonspecific skin eruption 04/14/2013    Assessment / Plan: 30 y.o. G2P0010 at 8685w2d here for IOL d/t preeclampsia with severe features.   Pre-E w/ Sf: on mag w/o signs/symptoms of toxicity; sever range BP earlier treated. Now just mild-rage pressures Labor: Pt with contractions on toco, but not feeling them. Cytotec #2 placed Fetal Wellbeing:  Category II with occasional contraction, but reactive  Pain Control:  May have Epidural when in active labor Anticipated MOD:   SVD  Frederik PearJulie P Degele, MD 04/12/2016, 7:40 PM

## 2016-04-13 ENCOUNTER — Encounter (HOSPITAL_COMMUNITY): Payer: Self-pay | Admitting: Anesthesiology

## 2016-04-13 ENCOUNTER — Inpatient Hospital Stay (HOSPITAL_COMMUNITY): Payer: BLUE CROSS/BLUE SHIELD | Admitting: Anesthesiology

## 2016-04-13 LAB — CBC
HCT: 35.8 % — ABNORMAL LOW (ref 36.0–46.0)
HEMATOCRIT: 36.2 % (ref 36.0–46.0)
HEMOGLOBIN: 12.4 g/dL (ref 12.0–15.0)
Hemoglobin: 12.2 g/dL (ref 12.0–15.0)
MCH: 26.2 pg (ref 26.0–34.0)
MCH: 26.6 pg (ref 26.0–34.0)
MCHC: 34.1 g/dL (ref 30.0–36.0)
MCHC: 34.3 g/dL (ref 30.0–36.0)
MCV: 76.8 fL — AB (ref 78.0–100.0)
MCV: 77.5 fL — ABNORMAL LOW (ref 78.0–100.0)
PLATELETS: 185 10*3/uL (ref 150–400)
Platelets: 203 10*3/uL (ref 150–400)
RBC: 4.66 MIL/uL (ref 3.87–5.11)
RBC: 4.67 MIL/uL (ref 3.87–5.11)
RDW: 14.8 % (ref 11.5–15.5)
RDW: 15 % (ref 11.5–15.5)
WBC: 11.3 10*3/uL — ABNORMAL HIGH (ref 4.0–10.5)
WBC: 16.3 10*3/uL — AB (ref 4.0–10.5)

## 2016-04-13 LAB — RPR: RPR: NONREACTIVE

## 2016-04-13 MED ORDER — DIPHENHYDRAMINE HCL 50 MG/ML IJ SOLN: 12.5000 mg | INTRAMUSCULAR | Status: DC | PRN

## 2016-04-13 MED ORDER — LACTATED RINGERS IV SOLN
500.0000 mL | Freq: Once | INTRAVENOUS | Status: AC
  Administered 2016-04-13: 500 mL via INTRAVENOUS

## 2016-04-13 MED ORDER — FENTANYL CITRATE (PF) 100 MCG/2ML IJ SOLN
100.0000 ug | INTRAMUSCULAR | Status: DC | PRN
  Administered 2016-04-13: 100 ug via INTRAVENOUS
  Filled 2016-04-13: qty 2

## 2016-04-13 MED ORDER — TERBUTALINE SULFATE 1 MG/ML IJ SOLN: 0.2500 mg | Freq: Once | INTRAMUSCULAR | Status: DC | PRN

## 2016-04-13 MED ORDER — ZOLPIDEM TARTRATE 5 MG PO TABS
5.0000 mg | ORAL_TABLET | Freq: Every evening | ORAL | Status: DC | PRN
  Administered 2016-04-13: 5 mg via ORAL
  Filled 2016-04-13: qty 1

## 2016-04-13 MED ORDER — FENTANYL 2.5 MCG/ML BUPIVACAINE 1/10 % EPIDURAL INFUSION (WH - ANES)
14.0000 mL/h | INTRAMUSCULAR | Status: DC | PRN
  Administered 2016-04-13 (×2): 14 mL/h via EPIDURAL
  Filled 2016-04-13 (×2): qty 125

## 2016-04-13 MED ORDER — EPHEDRINE 5 MG/ML INJ: 10.0000 mg | INTRAVENOUS | Status: DC | PRN

## 2016-04-13 MED ORDER — OXYTOCIN 40 UNITS IN LACTATED RINGERS INFUSION - SIMPLE MED
1.0000 m[IU]/min | INTRAVENOUS | Status: DC
  Administered 2016-04-13: 2 m[IU]/min via INTRAVENOUS

## 2016-04-13 MED ORDER — SODIUM BICARBONATE 8.4 % IV SOLN
INTRAVENOUS | Status: DC | PRN
  Administered 2016-04-13: 3 mL via EPIDURAL
  Administered 2016-04-13: 2 mL via EPIDURAL
  Administered 2016-04-13: 3 mL via EPIDURAL
  Administered 2016-04-14 (×3): 5 mL via EPIDURAL

## 2016-04-13 MED ORDER — PHENYLEPHRINE 40 MCG/ML (10ML) SYRINGE FOR IV PUSH (FOR BLOOD PRESSURE SUPPORT): 80.0000 ug | PREFILLED_SYRINGE | INTRAVENOUS | Status: DC | PRN

## 2016-04-13 MED ORDER — LIDOCAINE HCL (PF) 1 % IJ SOLN
INTRAMUSCULAR | Status: DC | PRN
  Administered 2016-04-13 (×2): 7 mL via EPIDURAL

## 2016-04-13 MED ORDER — PHENYLEPHRINE 40 MCG/ML (10ML) SYRINGE FOR IV PUSH (FOR BLOOD PRESSURE SUPPORT)
80.0000 ug | PREFILLED_SYRINGE | INTRAVENOUS | Status: DC | PRN
  Filled 2016-04-13 (×2): qty 10

## 2016-04-13 NOTE — Anesthesia Procedure Notes (Signed)
Epidural Patient location during procedure: OB  Staffing Anesthesiologist: Karie SchwalbeJUDD, Raydan Schlabach Performed: anesthesiologist   Preanesthetic Checklist Completed: patient identified, site marked, surgical consent, pre-op evaluation, timeout performed, IV checked, risks and benefits discussed and monitors and equipment checked  Epidural Patient position: sitting Prep: site prepped and draped and DuraPrep Patient monitoring: continuous pulse ox and blood pressure Approach: midline Location: L3-L4 Injection technique: LOR saline  Needle:  Needle type: Tuohy  Needle gauge: 17 G Needle length: 9 cm and 9 Needle insertion depth: 5 cm cm Catheter type: closed end flexible Catheter size: 19 Gauge Catheter at skin depth: 10 cm Test dose: negative  Assessment Events: blood not aspirated, injection not painful, no injection resistance, negative IV test and no paresthesia  Additional Notes Patient identified. Risks/Benefits/Options discussed with patient including but not limited to bleeding, infection, nerve damage, paralysis, failed block, incomplete pain control, headache, blood pressure changes, nausea, vomiting, reactions to medication both or allergic, itching and postpartum back pain. Confirmed with bedside nurse the patient's most recent platelet count. Confirmed with patient that they are not currently taking any anticoagulation, have any bleeding history or any family history of bleeding disorders. Patient expressed understanding and wished to proceed. All questions were answered. Sterile technique was used throughout the entire procedure. Please see nursing notes for vital signs. Test dose was given through epidural catheter and negative prior to continuing to dose epidural or start infusion. Warning signs of high block given to the patient including shortness of breath, tingling/numbness in hands, complete motor block, or any concerning symptoms with instructions to call for help. Patient was  given instructions on fall risk and not to get out of bed. All questions and concerns addressed with instructions to call with any issues or inadequate analgesia.    Patient had used the PCEA throughout the day multiple times with no relief. Called to evaluate and bolus given but not adequate pain relief. New epidural placed and patient comfortable. Pleased with anesthesia care.

## 2016-04-13 NOTE — Anesthesia Preprocedure Evaluation (Signed)
Anesthesia Evaluation  Patient identified by MRN, date of birth, ID band Patient awake    Reviewed: Allergy & Precautions, H&P , NPO status , Patient's Chart, lab work & pertinent test results  Airway Mallampati: II  TM Distance: >3 FB Neck ROM: full    Dental no notable dental hx.    Pulmonary neg pulmonary ROS,    Pulmonary exam normal        Cardiovascular Normal cardiovascular exam     Neuro/Psych negative neurological ROS  negative psych ROS   GI/Hepatic negative GI ROS, Neg liver ROS,   Endo/Other  negative endocrine ROS  Renal/GU negative Renal ROS     Musculoskeletal   Abdominal (+) + obese,   Peds  Hematology negative hematology ROS (+)   Anesthesia Other Findings   Reproductive/Obstetrics (+) Pregnancy                             Anesthesia Physical Anesthesia Plan  ASA: II  Anesthesia Plan: Epidural   Post-op Pain Management:    Induction:   Airway Management Planned:   Additional Equipment:   Intra-op Plan:   Post-operative Plan:   Informed Consent: I have reviewed the patients History and Physical, chart, labs and discussed the procedure including the risks, benefits and alternatives for the proposed anesthesia with the patient or authorized representative who has indicated his/her understanding and acceptance.     Plan Discussed with:   Anesthesia Plan Comments:         Anesthesia Quick Evaluation

## 2016-04-13 NOTE — Progress Notes (Signed)
   Krystal Dixon is a 30 y.o. G2P0010 at 334w3d  admitted for induction of labor due to preeclampsia.  Subjective:  Rectal pressure with urge to push Objective: Vitals:   04/13/16 2319 04/13/16 2320 04/13/16 2324 04/13/16 2325  BP:  105/67  115/68  Pulse: 78 75 78 76  Resp:      Temp:      TempSrc:      SpO2: 99%  98% 98%  Weight:      Height:       Total I/O In: 139 [I.V.:125; Other:14] Out: 100 [Urine:100]  FHT:  FHR: 150 bpm, variability: moderate,  accelerations:  Present,  decelerations:  Absent UC:   MVUs 200 SVE:   C/c/=2 Pitocin @ 14 mu/min  Labs: Lab Results  Component Value Date   WBC 16.3 (H) 04/13/2016   HGB 12.4 04/13/2016   HCT 36.2 04/13/2016   MCV 77.5 (L) 04/13/2016   PLT 203 04/13/2016    Assessment / Plan: Induction of labor due to preeclampsia,  progressing well on pitocin; start pushing  Labor: Progressing normally Fetal Wellbeing:  Category I Pain Control:  Epidural Anticipated MOD:  NSVD  CRESENZO-DISHMAN,Briley Bumgarner 04/13/2016, 11:30 PM

## 2016-04-13 NOTE — Anesthesia Procedure Notes (Signed)
Epidural Patient location during procedure: OB Start time: 04/13/2016 12:45 PM End time: 04/13/2016 12:49 PM  Staffing Anesthesiologist: Leilani AbleHATCHETT, Octavion Mollenkopf Performed: anesthesiologist   Preanesthetic Checklist Completed: patient identified, site marked, surgical consent, pre-op evaluation, timeout performed, IV checked, risks and benefits discussed and monitors and equipment checked  Epidural Patient position: sitting Prep: site prepped and draped and DuraPrep Patient monitoring: continuous pulse ox and blood pressure Approach: midline Location: L3-L4 Injection technique: LOR air  Needle:  Needle type: Tuohy  Needle gauge: 17 G Needle length: 9 cm and 9 Needle insertion depth: 6 cm Catheter type: closed end flexible Catheter size: 19 Gauge Catheter at skin depth: 11 cm Test dose: negative and Other  Assessment Sensory level: T9 Events: blood not aspirated, injection not painful, no injection resistance, negative IV test and no paresthesia  Additional Notes Reason for block:procedure for pain

## 2016-04-13 NOTE — Progress Notes (Signed)
LABOR PROGRESS NOTE  Krystal OliphantLissette Dixon is a 30 y.o. G2P0010 at 6542w3d  admitted for IOL 2/2 preE with severe features  Subjective: Doing well, starting to feel contractions more  Objective: BP (!) 146/93   Pulse 78   Temp 98.1 F (36.7 C) (Oral)   Resp 16   Ht 5\' 3"  (1.6 m)   Wt 81.2 kg (179 lb)   LMP 06/14/2015 (Approximate)   BMI 31.71 kg/m  or  Vitals:   04/12/16 2100 04/12/16 2130 04/12/16 2200 04/12/16 2230  BP: (!) 145/99 (!) 141/96 (!) 139/97 (!) 146/93  Pulse: 85 78 82 78  Resp: 16  16 16   Temp:      TempSrc:      Weight:      Height:         Dilation: Fingertip Effacement (%): 50 Cervical Position: Posterior Station: -3 Presentation: Vertex Exam by:: Dr.J. Degele  2/80/-2 cervical exam FHT: 135 baseline, moderate variability, +accelerations, no decelerations Uterine activity: q1-283min  Labs: Lab Results  Component Value Date   WBC 8.5 04/12/2016   HGB 12.3 04/12/2016   HCT 36.1 04/12/2016   MCV 77.1 (L) 04/12/2016   PLT 176 04/12/2016    Patient Active Problem List   Diagnosis Date Noted  . Gestational hypertension 04/12/2016  . Elevated blood pressure reading without diagnosis of hypertension 04/11/2016  . Pruritic rash 02/18/2016  . Encounter for supervision of other normal pregnancy 01/27/2016  . Supervision of normal first pregnancy 09/30/2015  . Nausea and vomiting during pregnancy prior to [redacted] weeks gestation 09/30/2015  . Rash and nonspecific skin eruption 04/14/2013    Assessment / Plan: 30 y.o. G2P0010 at 10942w3d here for IOL 2/2 preE with severe features  Labor: foley bulb placed @ 0005 Fetal Wellbeing:  Category I Pain Control:  Plan for epidural in active labor Anticipated MOD:  SVD  Krystal HerElsia J Idelle Reimann, DO PGY-1 9/7/201712:06 AM

## 2016-04-13 NOTE — Progress Notes (Signed)
LABOR PROGRESS NOTE  Sharlynn OliphantLissette Lucatero is a 30 y.o. G2P0010 with IUP at 460w2d admitted for IOL d/t gestational HTN. Now dx with preeclampsia with severe features.  Subjective: Patient doing well w/o concerns. Denies headaches, visual disturbances, RUQ or any concerns. Pain well controlled with epidural.   Objective: BP 128/90   Pulse 70   Temp 97.7 F (36.5 C) (Oral)   Resp 18   Ht 5\' 3"  (1.6 m)   Wt 179 lb (81.2 kg)   LMP 06/14/2015 (Approximate)   SpO2 99%   BMI 31.71 kg/m  or  Vitals:   04/13/16 1430 04/13/16 1500 04/13/16 1530 04/13/16 1600  BP: 129/87 128/84 129/84 128/90  Pulse: 78 71 69 70  Resp: 20 20 18 18   Temp:    97.7 F (36.5 C)  TempSrc:    Oral  SpO2:      Weight:      Height:        NAD. Normocephalic Dilation: 7 Effacement (%): 80 Cervical Position: Anterior Station: 0 Presentation: Vertex Exam by:: Dr. Nira Retortegele  Toco: ctx q 2-4 min FHT: baseline 135, moderate variability, + acel, no decel  Labs: Lab Results  Component Value Date   WBC 11.3 (H) 04/13/2016   HGB 12.2 04/13/2016   HCT 35.8 (L) 04/13/2016   MCV 76.8 (L) 04/13/2016   PLT 185 04/13/2016    Patient Active Problem List   Diagnosis Date Noted  . Gestational hypertension 04/12/2016  . Elevated blood pressure reading without diagnosis of hypertension 04/11/2016  . Pruritic rash 02/18/2016  . Encounter for supervision of other normal pregnancy 01/27/2016  . Supervision of normal first pregnancy 09/30/2015  . Nausea and vomiting during pregnancy prior to [redacted] weeks gestation 09/30/2015  . Rash and nonspecific skin eruption 04/14/2013    Assessment / Plan: 30 y.o. G2P0010 at 8060w2d here for IOL d/t preeclampsia with severe features.   Pre-E w/ SF: on mag w/o signs/symptoms of toxicity; adequate UOP; recent BPs mild range Labor: AROM at 1615. Continue Pit Fetal Wellbeing:  Category I Pain Control: Adequate with Epidural Anticipated MOD:  SVD  Frederik PearJulie P Tariq Pernell, MD 04/13/2016,  4:26 PM

## 2016-04-14 ENCOUNTER — Encounter (HOSPITAL_COMMUNITY): Payer: Self-pay

## 2016-04-14 ENCOUNTER — Encounter (HOSPITAL_COMMUNITY)
Admission: AD | Disposition: A | Discharge status: Home / Self Care | Payer: Self-pay | Source: Ambulatory Visit | Attending: Obstetrics & Gynecology

## 2016-04-14 DIAGNOSIS — O339 Maternal care for disproportion, unspecified: Secondary | ICD-10-CM

## 2016-04-14 DIAGNOSIS — Z3A39 39 weeks gestation of pregnancy: Secondary | ICD-10-CM

## 2016-04-14 DIAGNOSIS — O324XX0 Maternal care for high head at term, not applicable or unspecified: Secondary | ICD-10-CM

## 2016-04-14 DIAGNOSIS — O1414 Severe pre-eclampsia complicating childbirth: Secondary | ICD-10-CM

## 2016-04-14 LAB — CBC
HEMATOCRIT: 32.6 % — AB (ref 36.0–46.0)
Hemoglobin: 11 g/dL — ABNORMAL LOW (ref 12.0–15.0)
MCH: 26 pg (ref 26.0–34.0)
MCHC: 33.7 g/dL (ref 30.0–36.0)
MCV: 77.1 fL — AB (ref 78.0–100.0)
Platelets: 173 10*3/uL (ref 150–400)
RBC: 4.23 MIL/uL (ref 3.87–5.11)
RDW: 15 % (ref 11.5–15.5)
WBC: 21 10*3/uL — ABNORMAL HIGH (ref 4.0–10.5)

## 2016-04-14 LAB — MAGNESIUM: MAGNESIUM: 6.5 mg/dL — AB (ref 1.7–2.4)

## 2016-04-14 SURGERY — Surgical Case
Anesthesia: Epidural | Site: Abdomen

## 2016-04-14 MED ORDER — NALBUPHINE HCL 10 MG/ML IJ SOLN: 5.0000 mg | INTRAMUSCULAR | Status: DC | PRN

## 2016-04-14 MED ORDER — COCONUT OIL OIL
1.0000 "application " | TOPICAL_OIL | Status: DC | PRN
  Filled 2016-04-14: qty 120

## 2016-04-14 MED ORDER — DIPHENHYDRAMINE HCL 50 MG/ML IJ SOLN
12.5000 mg | INTRAMUSCULAR | Status: DC | PRN
  Administered 2016-04-14: 12.5 mg via INTRAVENOUS
  Filled 2016-04-14: qty 1

## 2016-04-14 MED ORDER — ACETAMINOPHEN 325 MG PO TABS
650.0000 mg | ORAL_TABLET | ORAL | Status: DC | PRN
Start: 2016-04-14 — End: 2016-04-16
  Administered 2016-04-15: 650 mg via ORAL
  Filled 2016-04-14: qty 2

## 2016-04-14 MED ORDER — LIDOCAINE-EPINEPHRINE (PF) 2 %-1:200000 IJ SOLN
INTRAMUSCULAR | Status: AC
  Filled 2016-04-14: qty 20

## 2016-04-14 MED ORDER — KETOROLAC TROMETHAMINE 30 MG/ML IJ SOLN: 30.0000 mg | Freq: Four times a day (QID) | INTRAMUSCULAR | Status: AC | PRN

## 2016-04-14 MED ORDER — LACTATED RINGERS IV SOLN
INTRAVENOUS | Status: DC | PRN
  Administered 2016-04-14: 02:00:00 via INTRAVENOUS

## 2016-04-14 MED ORDER — ONDANSETRON HCL 4 MG/2ML IJ SOLN
INTRAMUSCULAR | Status: DC | PRN
  Administered 2016-04-14: 4 mg via INTRAVENOUS

## 2016-04-14 MED ORDER — SENNOSIDES-DOCUSATE SODIUM 8.6-50 MG PO TABS
2.0000 | ORAL_TABLET | ORAL | Status: DC
  Administered 2016-04-14 – 2016-04-15 (×2): 2 via ORAL
  Filled 2016-04-14 (×4): qty 2

## 2016-04-14 MED ORDER — FENTANYL CITRATE (PF) 100 MCG/2ML IJ SOLN
INTRAMUSCULAR | Status: AC
  Filled 2016-04-14: qty 2

## 2016-04-14 MED ORDER — SCOPOLAMINE 1 MG/3DAYS TD PT72
MEDICATED_PATCH | TRANSDERMAL | Status: AC
  Filled 2016-04-14: qty 1

## 2016-04-14 MED ORDER — DIBUCAINE 1 % RE OINT
1.0000 "application " | TOPICAL_OINTMENT | RECTAL | Status: DC | PRN
  Filled 2016-04-14: qty 28.4

## 2016-04-14 MED ORDER — PHENYLEPHRINE 40 MCG/ML (10ML) SYRINGE FOR IV PUSH (FOR BLOOD PRESSURE SUPPORT)
PREFILLED_SYRINGE | INTRAVENOUS | Status: AC
  Filled 2016-04-14: qty 20

## 2016-04-14 MED ORDER — MAGNESIUM SULFATE 50 % IJ SOLN
1.0000 g/h | INTRAVENOUS | Status: AC
  Administered 2016-04-14: 2 g/h via INTRAVENOUS
  Filled 2016-04-14: qty 80

## 2016-04-14 MED ORDER — OXYTOCIN 10 UNIT/ML IJ SOLN
INTRAMUSCULAR | Status: AC
  Filled 2016-04-14: qty 4

## 2016-04-14 MED ORDER — NALBUPHINE HCL 10 MG/ML IJ SOLN: 5.0000 mg | Freq: Once | INTRAMUSCULAR | Status: DC | PRN

## 2016-04-14 MED ORDER — NITROGLYCERIN 0.2 MG/ML ON CALL CATH LAB
INTRAVENOUS | Status: DC | PRN
  Administered 2016-04-14: 200 ug via INTRAVENOUS

## 2016-04-14 MED ORDER — SODIUM CHLORIDE 0.9% FLUSH: 3.0000 mL | INTRAVENOUS | Status: DC | PRN

## 2016-04-14 MED ORDER — NALBUPHINE HCL 10 MG/ML IJ SOLN
5.0000 mg | Freq: Once | INTRAMUSCULAR | Status: DC | PRN
  Filled 2016-04-14: qty 1

## 2016-04-14 MED ORDER — LACTATED RINGERS IV SOLN
INTRAVENOUS | Status: DC
  Administered 2016-04-14 – 2016-04-15 (×2): via INTRAVENOUS

## 2016-04-14 MED ORDER — ONDANSETRON HCL 4 MG/2ML IJ SOLN: 4.0000 mg | Freq: Three times a day (TID) | INTRAMUSCULAR | Status: DC | PRN

## 2016-04-14 MED ORDER — SCOPOLAMINE 1 MG/3DAYS TD PT72
MEDICATED_PATCH | TRANSDERMAL | Status: DC | PRN
  Administered 2016-04-14: 1 via TRANSDERMAL

## 2016-04-14 MED ORDER — DIPHENHYDRAMINE HCL 25 MG PO CAPS
25.0000 mg | ORAL_CAPSULE | Freq: Four times a day (QID) | ORAL | Status: DC | PRN
  Filled 2016-04-14: qty 1

## 2016-04-14 MED ORDER — CEFAZOLIN SODIUM-DEXTROSE 2-4 GM/100ML-% IV SOLN
2.0000 g | Freq: Once | INTRAVENOUS | Status: AC
  Administered 2016-04-14: 2 g via INTRAVENOUS

## 2016-04-14 MED ORDER — MEPERIDINE HCL 25 MG/ML IJ SOLN: 6.2500 mg | INTRAMUSCULAR | Status: DC | PRN

## 2016-04-14 MED ORDER — CEFAZOLIN SODIUM-DEXTROSE 2-4 GM/100ML-% IV SOLN
INTRAVENOUS | Status: AC
  Filled 2016-04-14: qty 100

## 2016-04-14 MED ORDER — ONDANSETRON HCL 4 MG/2ML IJ SOLN
INTRAMUSCULAR | Status: AC
  Filled 2016-04-14: qty 4

## 2016-04-14 MED ORDER — IBUPROFEN 600 MG PO TABS
600.0000 mg | ORAL_TABLET | Freq: Four times a day (QID) | ORAL | Status: DC
  Administered 2016-04-14 – 2016-04-16 (×8): 600 mg via ORAL
  Filled 2016-04-14 (×8): qty 1

## 2016-04-14 MED ORDER — NALBUPHINE HCL 10 MG/ML IJ SOLN
5.0000 mg | INTRAMUSCULAR | Status: DC | PRN
  Administered 2016-04-14: 5 mg via INTRAVENOUS

## 2016-04-14 MED ORDER — LIDOCAINE-EPINEPHRINE (PF) 2 %-1:200000 IJ SOLN
INTRAMUSCULAR | Status: DC | PRN
  Administered 2016-04-14: 5 mL

## 2016-04-14 MED ORDER — MORPHINE SULFATE (PF) 0.5 MG/ML IJ SOLN
INTRAMUSCULAR | Status: DC | PRN
  Administered 2016-04-14: 3.5 mg via EPIDURAL
  Administered 2016-04-14: 1.5 mg via INTRAVENOUS

## 2016-04-14 MED ORDER — OXYTOCIN 10 UNIT/ML IJ SOLN
INTRAVENOUS | Status: DC | PRN
  Administered 2016-04-14: 40 [IU] via INTRAVENOUS

## 2016-04-14 MED ORDER — ACETAMINOPHEN 500 MG PO TABS
1000.0000 mg | ORAL_TABLET | Freq: Four times a day (QID) | ORAL | Status: AC
  Administered 2016-04-14 (×4): 1000 mg via ORAL
  Filled 2016-04-14 (×4): qty 2

## 2016-04-14 MED ORDER — NALOXONE HCL 2 MG/2ML IJ SOSY
1.0000 ug/kg/h | PREFILLED_SYRINGE | INTRAVENOUS | Status: DC | PRN
  Filled 2016-04-14: qty 2

## 2016-04-14 MED ORDER — DEXAMETHASONE SODIUM PHOSPHATE 4 MG/ML IJ SOLN
INTRAMUSCULAR | Status: DC | PRN
  Administered 2016-04-14: 4 mg via INTRAVENOUS

## 2016-04-14 MED ORDER — SODIUM CHLORIDE 0.9 % IR SOLN
Status: DC | PRN
  Administered 2016-04-14: 1000 mL

## 2016-04-14 MED ORDER — FENTANYL CITRATE (PF) 100 MCG/2ML IJ SOLN
INTRAMUSCULAR | Status: DC | PRN
  Administered 2016-04-14 (×2): 50 ug via INTRAVENOUS

## 2016-04-14 MED ORDER — ZOLPIDEM TARTRATE 5 MG PO TABS: 5.0000 mg | ORAL_TABLET | Freq: Every evening | ORAL | Status: DC | PRN

## 2016-04-14 MED ORDER — DEXAMETHASONE SODIUM PHOSPHATE 4 MG/ML IJ SOLN
INTRAMUSCULAR | Status: AC
  Filled 2016-04-14: qty 1

## 2016-04-14 MED ORDER — SIMETHICONE 80 MG PO CHEW
80.0000 mg | CHEWABLE_TABLET | ORAL | Status: DC
  Administered 2016-04-14 – 2016-04-15 (×2): 80 mg via ORAL
  Filled 2016-04-14 (×4): qty 1

## 2016-04-14 MED ORDER — ONDANSETRON HCL 4 MG/2ML IJ SOLN: 4.0000 mg | Freq: Once | INTRAMUSCULAR | Status: DC | PRN

## 2016-04-14 MED ORDER — MAGNESIUM SULFATE 40 G IN LACTATED RINGERS - SIMPLE
INTRAVENOUS | Status: DC | PRN
  Administered 2016-04-14: 2 g/h via INTRAVENOUS

## 2016-04-14 MED ORDER — MEASLES, MUMPS & RUBELLA VAC ~~LOC~~ INJ
0.5000 mL | INJECTION | Freq: Once | SUBCUTANEOUS | Status: DC
  Filled 2016-04-14: qty 0.5

## 2016-04-14 MED ORDER — SIMETHICONE 80 MG PO CHEW
80.0000 mg | CHEWABLE_TABLET | ORAL | Status: DC | PRN
  Filled 2016-04-14: qty 1

## 2016-04-14 MED ORDER — MENTHOL 3 MG MT LOZG
1.0000 | LOZENGE | OROMUCOSAL | Status: DC | PRN
  Filled 2016-04-14: qty 9

## 2016-04-14 MED ORDER — DIPHENHYDRAMINE HCL 25 MG PO CAPS: 25.0000 mg | ORAL_CAPSULE | ORAL | Status: DC | PRN

## 2016-04-14 MED ORDER — MORPHINE SULFATE (PF) 0.5 MG/ML IJ SOLN
INTRAMUSCULAR | Status: AC
  Filled 2016-04-14: qty 10

## 2016-04-14 MED ORDER — MAGNESIUM SULFATE 50 % IJ SOLN: 1.0000 g/h | INTRAVENOUS | Status: DC

## 2016-04-14 MED ORDER — TETANUS-DIPHTH-ACELL PERTUSSIS 5-2.5-18.5 LF-MCG/0.5 IM SUSP
0.5000 mL | Freq: Once | INTRAMUSCULAR | Status: DC
  Filled 2016-04-14: qty 0.5

## 2016-04-14 MED ORDER — OXYTOCIN 40 UNITS IN LACTATED RINGERS INFUSION - SIMPLE MED
2.5000 [IU]/h | INTRAVENOUS | Status: AC
Start: 2016-04-14 — End: 2016-04-14

## 2016-04-14 MED ORDER — WITCH HAZEL-GLYCERIN EX PADS: 1.0000 "application " | MEDICATED_PAD | CUTANEOUS | Status: DC | PRN

## 2016-04-14 MED ORDER — PRENATAL MULTIVITAMIN CH
1.0000 | ORAL_TABLET | Freq: Every day | ORAL | Status: DC
  Administered 2016-04-14 – 2016-04-16 (×3): 1 via ORAL
  Filled 2016-04-14 (×5): qty 1

## 2016-04-14 MED ORDER — SCOPOLAMINE 1 MG/3DAYS TD PT72
1.0000 | MEDICATED_PATCH | Freq: Once | TRANSDERMAL | Status: DC
  Filled 2016-04-14: qty 1

## 2016-04-14 MED ORDER — FENTANYL CITRATE (PF) 100 MCG/2ML IJ SOLN
INTRAMUSCULAR | Status: AC
  Administered 2016-04-14: 25 ug
  Filled 2016-04-14: qty 2

## 2016-04-14 MED ORDER — PHENYLEPHRINE HCL 10 MG/ML IJ SOLN
INTRAMUSCULAR | Status: DC | PRN
  Administered 2016-04-14 (×2): 40 ug via INTRAVENOUS
  Administered 2016-04-14 (×2): 80 ug via INTRAVENOUS
  Administered 2016-04-14: 40 ug via INTRAVENOUS

## 2016-04-14 MED ORDER — NALOXONE HCL 0.4 MG/ML IJ SOLN: 0.4000 mg | INTRAMUSCULAR | Status: DC | PRN

## 2016-04-14 MED ORDER — NITROGLYCERIN IN D5W 200-5 MCG/ML-% IV SOLN
INTRAVENOUS | Status: AC
  Filled 2016-04-14: qty 250

## 2016-04-14 MED ORDER — FENTANYL CITRATE (PF) 100 MCG/2ML IJ SOLN: 25.0000 ug | INTRAMUSCULAR | Status: DC | PRN

## 2016-04-14 SURGICAL SUPPLY — 37 items
APL SKNCLS STERI-STRIP NONHPOA (GAUZE/BANDAGES/DRESSINGS) ×1
BENZOIN TINCTURE PRP APPL 2/3 (GAUZE/BANDAGES/DRESSINGS) ×1 IMPLANT
CHLORAPREP W/TINT 26ML (MISCELLANEOUS) ×2 IMPLANT
CLAMP CORD UMBIL (MISCELLANEOUS) IMPLANT
CLOTH BEACON ORANGE TIMEOUT ST (SAFETY) ×2 IMPLANT
DRAIN JACKSON PRT FLT 7MM (DRAIN) IMPLANT
DRSG OPSITE POSTOP 4X10 (GAUZE/BANDAGES/DRESSINGS) ×2 IMPLANT
ELECT REM PT RETURN 9FT ADLT (ELECTROSURGICAL) ×2
ELECTRODE REM PT RTRN 9FT ADLT (ELECTROSURGICAL) ×1 IMPLANT
EVACUATOR SILICONE 100CC (DRAIN) IMPLANT
EXTRACTOR VACUUM M CUP 4 TUBE (SUCTIONS) IMPLANT
GLOVE BIO SURGEON STRL SZ 6.5 (GLOVE) ×1 IMPLANT
GLOVE BIO SURGEON STRL SZ7 (GLOVE) ×2 IMPLANT
GLOVE BIOGEL PI IND STRL 6.5 (GLOVE) IMPLANT
GLOVE BIOGEL PI IND STRL 7.0 (GLOVE) ×2 IMPLANT
GLOVE BIOGEL PI INDICATOR 6.5 (GLOVE) ×1
GLOVE BIOGEL PI INDICATOR 7.0 (GLOVE) ×4
GOWN STRL REUS W/TWL LRG LVL3 (GOWN DISPOSABLE) ×5 IMPLANT
KIT ABG SYR 3ML LUER SLIP (SYRINGE) IMPLANT
NDL HYPO 25X5/8 SAFETYGLIDE (NEEDLE) ×1 IMPLANT
NEEDLE HYPO 25X5/8 SAFETYGLIDE (NEEDLE) ×2 IMPLANT
NS IRRIG 1000ML POUR BTL (IV SOLUTION) ×2 IMPLANT
PACK C SECTION WH (CUSTOM PROCEDURE TRAY) ×2 IMPLANT
PAD ABD 7.5X8 STRL (GAUZE/BANDAGES/DRESSINGS) ×1 IMPLANT
PAD OB MATERNITY 4.3X12.25 (PERSONAL CARE ITEMS) ×2 IMPLANT
PENCIL SMOKE EVAC W/HOLSTER (ELECTROSURGICAL) ×2 IMPLANT
RTRCTR C-SECT PINK 25CM LRG (MISCELLANEOUS) ×2 IMPLANT
SPONGE DRAIN TRACH 4X4 STRL 2S (GAUZE/BANDAGES/DRESSINGS) ×1 IMPLANT
SPONGE LAP 18X18 X RAY DECT (DISPOSABLE) ×1 IMPLANT
STRIP CLOSURE SKIN 1/2X4 (GAUZE/BANDAGES/DRESSINGS) ×1 IMPLANT
SUT MON AB-0 CT1 36 (SUTURE) ×1 IMPLANT
SUT PLAIN 2 0 XLH (SUTURE) ×1 IMPLANT
SUT VIC AB 0 CTX 36 (SUTURE) ×10
SUT VIC AB 0 CTX36XBRD ANBCTRL (SUTURE) ×5 IMPLANT
SUT VIC AB 4-0 KS 27 (SUTURE) ×2 IMPLANT
TOWEL OR 17X24 6PK STRL BLUE (TOWEL DISPOSABLE) ×3 IMPLANT
TRAY FOLEY CATH SILVER 14FR (SET/KITS/TRAYS/PACK) ×2 IMPLANT

## 2016-04-14 NOTE — Progress Notes (Signed)
Paged by RN at 1500 d/t patient being drowsy and sleepy. Went to evaluate patient. She was oriented x3, but somewhat somnolent. VSS. Lungs CTAB. Normal respiratory effort. Patellar DTRs 2+ bilaterally. Mag was held and level ordered.   Magnesium 6.5 mg/dL  RN report patient is now alert and feeling better. Will restart Mag now at 1 g/hr and monitor closely.  Discussed plan with Dr. Genevie AnnSchenk.

## 2016-04-14 NOTE — Op Note (Signed)
Krystal Dixon PROCEDURE DATE: 04/12/2016 - 04/14/2016  PREOPERATIVE DIAGNOSIS: Intrauterine pregnancy at  4313w4d weeks gestation with failure to descend  POSTOPERATIVE DIAGNOSIS: The same  PROCEDURE:    Low Transverse Cesarean Section  SURGEON:  Dr. Elsie LincolnKelly Davionna Blacksher  INDICATIONS: Krystal Dixon is a 30 y.o. G2P1011 at 2313w4d with failure to descend.  The risks of cesarean section discussed with the patient included but were not limited to: bleeding which may require transfusion or reoperation; infection which may require antibiotics; injury to bowel, bladder, ureters or other surrounding organs; injury to the fetus; need for additional procedures including hysterectomy in the event of a life-threatening hemorrhage; placental abnormalities wth subsequent pregnancies, incisional problems, thromboembolic phenomenon and other postoperative/anesthesia complications. The patient concurred with the proposed plan, giving informed written consent for the procedure.    FINDINGS:  Viable female  infant in cephalic presentation, 9,9 Apgars, weight to be determined in 1 hour, clear amniotic fluid.  Intact placenta, three vessel cord.  Grossly normal uterus, ovaries and fallopian tubes. .   ANESTHESIA:    Epidural   ESTIMATED BLOOD LOSS: 800  SPECIMENS: Placenta sent to L & D  COMPLICATIONS: None immediate  PROCEDURE IN DETAIL:  The patient received intravenous antibiotics and had sequential compression devices applied to her lower extremities.  Epidural anesthesia was dosed up to surgical level and was found to be adequate. She was then placed in a dorsal supine position with a leftward tilt, and prepped and draped in a sterile manner.  A foley catheter was placed into her bladder and attached to constant gravity.  After an adequate timeout was performed, a Pfannenstiel skin incision was made with scalpel and carried through to the underlying layer of fascia. The fascia was incised in the midline and  this incision was extended bilaterally using the Mayo scissors. Kocher clamps were applied to the superior aspect of the fascial incision and the underlying rectus muscles were dissected off bluntly. A similar process was carried out on the inferior aspect of the facial incision. The rectus muscles were separated in the midline bluntly and the peritoneum was entered bluntly.   A transverse hysterotomy was made with a scalpel and extended bilaterally bluntly. The bladder blade was then removed. The infant was successfully delivered, and cord was clamped and cut and infant was handed over to awaiting neonatology team. Uterine massage was then administered and the placenta delivered intact with three-vessel cord. The uterus was cleared of clot and debris.  The hysterotomy was closed with 0 vicryl.  A second imbricating suture of 0-Vicryl was used to reinforce the incision and aid in hemostasis.  The peritoneum and rectus muscles were noted to be hemostatic.  The fascia was closed with 0-Vicryl in a running fashion with good restoration of anatomy.  The subcutaneus tissue was copiously irrigated and re approximated with 0-plain suture.  The skin was closed with 4-0 Vicryl in a subcuticular fashion.    Pt tolerated the procedure will.  All counts were correct x2.  Pt went to the recovery room in stable condition.

## 2016-04-14 NOTE — Progress Notes (Signed)
Pt standing at bedside. Peri care performed. Pt report extreme sleepiness and feeling dizzy. Dr. Nira Retortegele notified. Orders received to shut magnesium off and put in for a STAT magnesium level. Mag off per request. Provider now at bedside to evaluate patient. Carmelina DaneERRI L Keene Gilkey, RN

## 2016-04-14 NOTE — Lactation Note (Signed)
This note was copied from a baby's chart. Lactation Consultation Note  Patient Name: Krystal [name] OZDGU'YToday's Date: 04/14/2016 Reason for consult: Follow-up assessment   With this mom of a term baby, now 4714 hours old. Mom has been supplementing breast feeding with up to 15 ml's of formula. I explained LEAD to mom, and advised her to decrease formula to 10 ml's, after breast feeding, so baby will be hungry more frequently, and go to breast. Mom reports baby spit formula earlier. I oto mom she is  Probably overfeeding the baby, and breastfeeding alone is plenty for her baby. . I reviewed pump initiation setting with mom, and hand expression, which should follow pumping each time. Mom knows to call for questions/concerns.    Maternal Data    Feeding Feeding Type: Bottle Fed - Formula Nipple Type: Slow - flow  LATCH Score/Interventions                      Lactation Tools Discussed/Used     Consult Status Consult Status: Follow-up Date: 04/15/16 Follow-up type: In-patient    Alfred LevinsLee, Keyondre Hepburn Anne 04/14/2016, 5:12 PM

## 2016-04-14 NOTE — Progress Notes (Signed)
Krystal Dixon is a 30 y.o. G2P0010 at 39w4 in active labor with pre eclampsia Subjective:   Objective: BP 140/88   Pulse 84   Temp 98 F (36.7 C) (Oral)   Resp 18   Ht 5\' 3"  (1.6 m)   Wt 179 lb (81.2 kg)   LMP 06/14/2015 (Approximate)   SpO2 99%   BMI 31.71 kg/m  I/O last 3 completed shifts: In: 4560 [P.O.:1260; I.V.:3213.9; Other:86.1] Out: 2900 [Urine:2700; Emesis/NG output:200] Total I/O In: 139 [I.V.:125; Other:14] Out: 100 [Urine:100]  FHT:  140 baseline, moderate variability, mild variable decelerations with contractions UC:   regular, every 2-3 minutes SVE:   Dilation: 10 Effacement (%): 80 Station: -1 Exam by:: Drenda FreezeFran C. CNM  Labs: Lab Results  Component Value Date   WBC 16.3 (H) 04/13/2016   HGB 12.4 04/13/2016   HCT 36.2 04/13/2016   MCV 77.5 (L) 04/13/2016   PLT 203 04/13/2016    Assessment / Plan: Arrest of decent  Labor: No descent of vertex in past 1.5 hours of good pushing.  Total time pushing 2 hours.  Pt refuses vacuum. Preeclampsia:  on magnesium sulfate Fetal Wellbeing:  Category II Pain Control:  Epidural I/D:  n/a Anticipated MOD:  primary c/s   The risks of cesarean section discussed with the patient included but were not limited to: bleeding which may require transfusion or reoperation; infection which may require antibiotics; injury to bowel, bladder, ureters or other surrounding organs; injury to the fetus; need for additional procedures including hysterectomy in the event of a life-threatening hemorrhage; placental abnormalities wth subsequent pregnancies, incisional problems, thromboembolic phenomenon and other postoperative/anesthesia complications. The patient concurred with the proposed plan, giving informed written consent for the procedure.    Arend Bahl H. 04/14/2016, 1:47 AM

## 2016-04-14 NOTE — Progress Notes (Signed)
Results for Krystal Dixon, Krystal Dixon (MRN [id number]) as of 04/14/2016 16:33  Ref. Range 04/14/2016 15:06  Magnesium Latest Ref Range: 1.7 - 2.4 mg/dL 6.5 (HH)  Dr.  Raynelle FanningJulie Degele notified of critical value @1608 . Instructed to restart Magnesium @ 1gram. Carmelina DaneERRI L Qasim Diveley, RN

## 2016-04-14 NOTE — Consult Note (Signed)
Neonatology Note:   Attendance at C-section:    I was asked by Dr. Leggett to attend this primary C/S at term due to failure of descent. The mother is a G2P0A1 O pos, GBS neg with gestational HTN. ROM 10 hours prior to delivery, fluid clear. Infant vigorous with good spontaneous cry and tone. Delayed cord clamping was done. Needed no suctioning. Ap 9/9. Lungs clear to ausc in DR. To CN to care of Pediatrician.   Mellody Masri C. Kaori Jumper, MD 

## 2016-04-14 NOTE — Transfer of Care (Signed)
Immediate Anesthesia Transfer of Care Note  Patient: Krystal Dixon  Procedure(s) Performed: Procedure(s): CESAREAN SECTION (N/A)  Patient Location: PACU  Anesthesia Type:Epidural  Level of Consciousness: awake, alert  and oriented  Airway & Oxygen Therapy: Patient Spontanous Breathing  Post-op Assessment: Report given to RN and Post -op Vital signs reviewed and stable  Post vital signs: Reviewed and stable  Last Vitals:  Vitals:   04/14/16 0000 04/14/16 0030  BP: (!) 146/88 140/88  Pulse: 79 84  Resp:  18  Temp:      Last Pain:  Vitals:   04/13/16 2325  TempSrc:   PainSc: 9       Patients Stated Pain Goal: 2 (04/13/16 1130)  Complications: No apparent anesthesia complications

## 2016-04-14 NOTE — Anesthesia Postprocedure Evaluation (Signed)
Anesthesia Post Note  Patient: Krystal Dixon  Procedure(s) Performed: Procedure(s) (LRB): CESAREAN SECTION (N/A)  Patient location during evaluation: A-ICU Anesthesia Type: Epidural Level of consciousness: awake Pain management: pain level controlled Vital Signs Assessment: post-procedure vital signs reviewed and stable Respiratory status: spontaneous breathing Cardiovascular status: stable Postop Assessment: no headache, no backache, epidural receding and patient able to bend at knees Anesthetic complications: no     Last Vitals:  Vitals:   04/14/16 1200 04/14/16 1300  BP:    Pulse:    Resp: 18 16  Temp:      Last Pain:  Vitals:   04/14/16 1200  TempSrc:   PainSc: 2    Pain Goal: Patients Stated Pain Goal: 2 (04/14/16 1200)               Edison PaceWILKERSON,Jhovany Weidinger

## 2016-04-14 NOTE — Lactation Note (Signed)
This note was copied from a baby's chart. Lactation Consultation Note  Patient Name: Krystal Dixon GNFAO'ZToday's Date: 04/14/2016 Reason for consult: Initial assessment   Follow up with first time mom of 8 hour old infant in AICU. Mom on MgSO4. Terri, mom's RN is encouraging mom to BF infant. She is going to set up a pump for mom later today.   Mom reports she BF infant earlier and then gave the infant formula since she does not have milk. Discussed Colostrum, milk coming to volume, and NB nutritional needs. Enc mom to BF first and then give formula as needed post BF.  Enc mom to start pumping post BF to encourage milk to come in due to EBL of 800 cc. Mom voiced understanding.   BF Resources Handout, Hand Expression Handout and LC Brochure given. Mom received a call so teaching was not finished. Enc mom to call with questions/concerns.    Maternal Data Formula Feeding for Exclusion: No Has patient been taught Hand Expression?: Yes (She reports she watched the video and described how to do) Does the patient have breastfeeding experience prior to this delivery?: No  Feeding Feeding Type: Bottle Fed - Formula Nipple Type: Slow - flow  LATCH Score/Interventions                      Lactation Tools Discussed/Used WIC Program: Yes   Consult Status Consult Status: Follow-up Date: 04/15/16 Follow-up type: In-patient    Krystal Dixon 04/14/2016, 11:15 AM

## 2016-04-14 NOTE — Anesthesia Postprocedure Evaluation (Signed)
Anesthesia Post Note  Patient: Krystal Dixon  Procedure(s) Performed: Procedure(s) (LRB): CESAREAN SECTION (N/A)  Patient location during evaluation: [facility] Unit Anesthesia Type: Epidural Level of consciousness: awake Pain management: satisfactory to patient Vital Signs Assessment: post-procedure vital signs reviewed and stable Respiratory status: spontaneous breathing Cardiovascular status: stable Anesthetic complications: no     Last Vitals:  Vitals:   04/14/16 0930 04/14/16 1030  BP: (!) 131/97 128/88  Pulse:    Resp: 18 18  Temp: 36.9 C     Last Pain:  Vitals:   04/14/16 0930  TempSrc: Oral  PainSc:    Pain Goal: Patients Stated Pain Goal: 2 (04/14/16 0830)               Cephus ShellingBURGER,Jadynn Epping

## 2016-04-15 LAB — BIRTH TISSUE RECOVERY COLLECTION (PLACENTA DONATION)

## 2016-04-15 NOTE — Progress Notes (Signed)
Report called to receiving RN in mother baby. Pt going to room 113.

## 2016-04-15 NOTE — Lactation Note (Signed)
This note was copied from a baby's chart. Lactation Consultation Note  Patient Name: Girl Sharlynn OliphantLissette Sian ZOXWR'UToday's Date: 04/15/2016   Visited with Mom, baby 5836 hrs old.  Baby has been primarily bottle fed formula.  RN set up DEBP.  Mom declined assistance with latch.  Mom stated she had tried once and it didn't work.  Explained how breastfeeding is learned by Mom and baby, and that her RN and LC are here to assist her.  Mom asked for more formula.  Explained that if she were to continue giving formula, pumping regularly when baby was fed, would help to support her milk supply.  Encouraged Mom to call for assistance as needed for positioning and latching.   LC to follow up in am.       Judee ClaraSmith, Darlen Gledhill E 04/15/2016, 3:30 PM

## 2016-04-15 NOTE — Progress Notes (Signed)
Patient ID: Krystal OliphantLissette Willemsen, female   DOB: 01-Aug-1986, 30 y.o.   MRN: [id number] Subjective: Postpartum Day 1: Cesarean Delivery Patient reports incisional pain, tolerating PO, + flatus and no problems voiding.    Objective: Vital signs in last 24 hours: Temp:  [98.2 F (36.8 C)-98.9 F (37.2 C)] 98.9 F (37.2 C) (09/09 0400) Pulse Rate:  [72-91] 77 (09/09 0406) Resp:  [16-18] 18 (09/09 0406) BP: (114-139)/(72-97) 114/72 (09/09 0406) SpO2:  [95 %-99 %] 98 % (09/09 0406)  Physical Exam:  General: alert, cooperative and no distress Lochia: appropriate Uterine Fundus: firm Incision: healing well, slow healing DVT Evaluation: No evidence of DVT seen on physical exam.   Recent Labs  04/13/16 2222 04/14/16 0750  HGB 12.4 11.0*  HCT 36.2 32.6*    Assessment/Plan: Status post Cesarean section. Postoperative course complicated by preeclampsia, recovering nicely  D/c mag sulfate Transfer to routine pp floor.  Ece Cumberland V 04/15/2016, 7:46 AM

## 2016-04-16 MED ORDER — OXYCODONE-ACETAMINOPHEN 5-325 MG PO TABS: 1.0000 | ORAL_TABLET | Freq: Four times a day (QID) | ORAL | 0 refills | Status: DC | PRN

## 2016-04-16 MED ORDER — SENNOSIDES-DOCUSATE SODIUM 8.6-50 MG PO TABS: 2.0000 | ORAL_TABLET | Freq: Two times a day (BID) | ORAL | 1 refills | Status: DC

## 2016-04-16 MED ORDER — IBUPROFEN 600 MG PO TABS: 600.0000 mg | ORAL_TABLET | Freq: Four times a day (QID) | ORAL | 0 refills | Status: DC

## 2016-04-16 NOTE — Progress Notes (Signed)
Patient ID: Krystal OliphantLissette Pignato, female   DOB: Jul 13, 1986, 30 y.o.   MRN: [id number]  POSTPARTUM PROGRESS NOTE  Post Op Day #2 Subjective:  Krystal Dixon is a 30 y.o. G2P1011 2065w4d s/p PLTCS.  No acute events overnight.  Pt denies problems with ambulating, voiding or po intake.  She denies nausea or vomiting.  Pain is moderately controlled.  She has had flatus. She has not had bowel movement.  Lochia Minimal.   Objective: Blood pressure 129/78, pulse (!) 59, temperature 98.5 F (36.9 C), temperature source Oral, resp. rate 17, height 5\' 3"  (1.6 m), weight 179 lb (81.2 kg), last menstrual period 06/14/2015, SpO2 99 %, unknown if currently breastfeeding.  Physical Exam:  General: alert, cooperative and no distress Lochia:normal flow Chest: CTAB Heart: RRR no m/r/g Abdomen: +BS, soft, nontender; incision clean, dry, intact, honeycomb dressing in place. Uterine Fundus: firm, below umbilicus DVT Evaluation: No calf swelling or tenderness Extremities: Trace edema   Recent Labs  04/13/16 2222 04/14/16 0750  HGB 12.4 11.0*  HCT 36.2 32.6*    Assessment/Plan:  ASSESSMENT: Krystal OliphantLissette Neyer is a 30 y.o. G2P1011 1765w4d s/p PLTCS.  Discharge home and Contraception Condoms   LOS: 4 days   Jen MowElizabeth Mumaw, DO 04/16/2016, 8:57 AM

## 2016-04-16 NOTE — Discharge Instructions (Signed)
Parto por cesrea - Cuidados posteriores  (Cesarean Delivery, Care After) Siga estas instrucciones durante las prximas semanas. Estas indicaciones le proporcionan informacin general acerca de cmo deber cuidarse despus del procedimiento. El mdico tambin podr darle instrucciones ms especficas. El tratamiento se ha planificado de acuerdo a las prcticas mdicas actuales, pero a veces se producen problemas. Comunquese con el mdico si tiene algn problema o tiene dudas cuando vuelva a su casa.  INSTRUCCIONES PARA EL CUIDADO EN EL HOGAR  Tome slo medicamentos de venta libre o recetados, segn las indicaciones del mdico.  No beba alcohol, especialmente si est amamantando o toma analgsicos.  Nomastique tabaco ni fume.  Contine con un adecuado cuidado perineal. El buen cuidado perineal incluye:  Higienizarse de adelante hacia atrs.  Mantener la zona perineal limpia.  Controlar diariamente el corte (incisin) y observar si aumenta el enrojecimiento, si supura, se hincha o se separa la piel.  Limpie la incisin suavemente con jabn y agua todos los das, y luego squela dando golpecitos. Si el mdico la autoriza, deje la incisin al descubierto. Use un apsito (vendaje) si drena lquido o la incisin parece irritada. Si las pequeas tiras [facility] que cruzan la incisin no se caen dentro de los 7 das, retrelas suavemente.  Abrace una almohada al toser o estornudar hasta que la incisin se cure. Esto ayuda a Best boy.  No conduzca vehculos ni opere maquinarias hasta que el mdico la autorice.  Dchese, lvese el cabello y tome baos de inmersin segn las indicaciones de su mdico.  Utilice un sostn que le ajuste bien y que brinde buen soporte a sus Glass blower/designer.  Limite el uso de bombachas de sostn o medias panty.  Beba suficiente lquido para Consulting civil engineer orina clara o de color amarillo plido.  Consuma todos los das alimentos ricos en fibra como cereales y panes  [place], arroz, frijoles y frutas frescas y verduras. Estos alimentos pueden ayudarla a prevenir o Cytogeneticist.  Reanude las actividades como subir escaleras, conducir automviles, levantar objetos pesados, hacer ejercicios o viajar cuando le indique su mdico.  Hable con su mdico acerca de reanudar la actividad sexual. Volver a la actividad sexual depende del riesgo de infeccin, la velocidad de la curacin y la comodidad y su deseo de Financial controller.  Trate de que alguien la ayude con las actividades del hogar y con el recin nacido al menos durante algunos das despus de salir del hospital.  Descanse todo lo que pueda. Trate de descansar o tomar una siesta mientras el beb est durmiendo.  Aumente sus actividades gradualmente.  Cumpla con todos los controles programados para despus del [place]. Es muy importante asistir a todas las visitas de Nurse, adult. En estas visitas, su mdico va a controlarla para asegurarse de que est sanando fsica y emocionalmente. SOLICITE ATENCIN MDICA SI:   Elimina cogulos grandes por la vagina. Guarde algunos cogulos para mostrarle al mdico.  Tiene una secrecin con feo olor que proviene de la vagina.  Tiene dificultad para orinar.  Orina con frecuencia.  Siente dolor al [employer].  Nota un cambio en sus movimientos intestinales.  Aumenta el enrojecimiento, el dolor o la hinchazn en la zona de la incisin.  Observa que supura pus en la incisin.  La incisin se abre.  Sus [employer] duelen, estn duras o enrojecidas.  Sufre un dolor intenso de [place].  Tiene visin borrosa o ve manchas.  Se siente triste o deprimida.  Tiene pensamientos acerca de lastimarse o daar al  recién nacido. °· Tiene preguntas acerca de su cuidado, la atención del recién nacido o acerca de los medicamentos. °· Se siente mareada o sufre un desmayo. °· Tiene una erupción. °· Siente dolor u observa enrojecimiento o hinchazón en el sitio en que  estaba la vía intravenosa (IV). °· Tiene náuseas o vómitos. °· Usted dejó de amamantar al bebé y no ha tenido su período menstrual dentro de las 12 semanas siguientes. °· No amamanta al bebé y no tuvo su período menstrual en las últimas 12 semanas. °· Tiene fiebre. °SOLICITE ATENCIÓN MÉDICA DE INMEDIATO SI:  °· Siente dolor persistente. °· Siente dolor en el pecho. °· Le falta el aire. °· Se desmaya. °· Siente dolor en la pierna. °· Siente dolor en el estómago. °· El sangrado vaginal satura dos o más apósitos en 1 hora. °ASEGÚRESE DE QUE:  °· Comprende estas instrucciones. °· Controlará su enfermedad. °· Recibirá ayuda de inmediato si no mejora o si empeora. °  °Esta información no tiene como fin reemplazar el consejo del médico. Asegúrese de hacerle al médico cualquier pregunta que tenga. °  °Document Released: 07/24/2005 Document Revised: 08/14/2014 °Elsevier Interactive Patient Education ©2016 Elsevier Inc. ° °

## 2016-04-16 NOTE — Discharge Summary (Signed)
OB Discharge Summary     Patient Name: Krystal OliphantLissette Brandon DOB: 1985/11/21 MRN: [id number]  Date of admission: 04/12/2016 Delivering MD: Elsie LincolnLEGGETT, KELLY H   Date of discharge: 04/16/2016  Admitting diagnosis: 39wks, high blood pressure Intrauterine pregnancy: 3433w4d     Secondary diagnosis:  Principal Problem:   Cesarean delivery delivered Active Problems:   Gestational hypertension  Additional problems: Preeclampsia with severe features (severe range blood pressures). Cephalopelvic disproportionate.      Discharge diagnosis: Term Pregnancy Delivered and Preeclampsia (severe)                                                                                                Post partum procedures:None  Augmentation: AROM, Pitocin, Cytotec and Foley Balloon  Complications: None  Hospital course:  Induction of Labor With Cesarean Section  30 y.o. yo G2P1011 at 5533w4d was admitted to the hospital 04/12/2016 for induction of labor. Patient had a labor course significant for preeclampsia with severe features due to severe range blood pressures. Patient went through labor, made it to complete but baby failed to descend into pelvis. The patient went for cesarean section due to Arrest of Descent, and delivered a Viable female infant, APGAR (1 MIN): 9  APGAR (5 MINS): 9.   Membrane Rupture Time/Date: )4:12 PM ,04/13/2016  .  Delivery can be found in separate operative Note.  Patient had an uncomplicated postpartum course. She is ambulating, tolerating a regular diet, passing flatus, and urinating well.  Patient is discharged home in stable condition on 04/16/16.                                     Physical exam Vitals:   04/15/16 1712 04/15/16 2134 04/16/16 0134 04/16/16 0623  BP: 127/82 123/81 125/81 129/78  Pulse: 79 71 68 (!) 59  Resp: 16 18 18 17   Temp: 99.4 F (37.4 C) 98.2 F (36.8 C) 98.3 F (36.8 C) 98.5 F (36.9 C)  TempSrc: Oral Oral Oral Oral  SpO2:  99% 99%   Weight:       Height:       General: alert, cooperative and no distress Lochia: appropriate Uterine Fundus: firm Incision: Healing well with no significant drainage, No significant erythema, Dressing is clean, dry, and intact DVT Evaluation: No evidence of DVT seen on physical exam. Negative Homan's sign. No cords or calf tenderness. Labs: Lab Results  Component Value Date   WBC 21.0 (H) 04/14/2016   HGB 11.0 (L) 04/14/2016   HCT 32.6 (L) 04/14/2016   MCV 77.1 (L) 04/14/2016   PLT 173 04/14/2016   CMP Latest Ref Rng & Units 04/12/2016  Glucose 65 - 99 mg/dL 78  BUN 6 - 20 mg/dL 12  Creatinine 4.090.44 - 8.111.00 mg/dL 9.140.64  Sodium 782135 - 956145 mmol/L 133(L)  Potassium 3.5 - 5.1 mmol/L 4.0  Chloride 101 - 111 mmol/L 107  CO2 22 - 32 mmol/L 21(L)  Calcium 8.9 - 10.3 mg/dL 8.3(L)  Total Protein 6.5 - 8.1 g/dL 6.4(L)  Total  Bilirubin 0.3 - 1.2 mg/dL 0.3  Alkaline Phos 38 - 126 U/L 154(H)  AST 15 - 41 U/L 23  ALT 14 - 54 U/L 19    Discharge instruction: per After Visit Summary and "Baby and Me Booklet".  After visit meds:    Medication List    TAKE these medications   ibuprofen 600 MG tablet Commonly known as:  ADVIL,MOTRIN Take 1 tablet (600 mg total) by mouth every 6 (six) hours.   oxyCODONE-acetaminophen 5-325 MG tablet Commonly known as:  ROXICET Take 1 tablet by mouth every 6 (six) hours as needed for severe pain.   Prenatal Vitamins 0.8 MG tablet Take 1 tablet by mouth daily.   senna-docusate 8.6-50 MG tablet Commonly known as:  Senokot-S Take 2 tablets by mouth 2 (two) times daily.       Diet: routine diet  Activity: Advance as tolerated. Pelvic rest for 6 weeks.   Outpatient follow up:2 weeks for incision check; 6 weeks for postpartum follow up. Follow up Appt:No future appointments. Follow up Visit:No Follow-up on file.  Postpartum contraception: Condoms  Newborn Data: Live born female  Birth Weight: 6 lb 14.1 oz (3120 g) APGAR: 9, 9  Baby Feeding:  Bottle Disposition:home with mother   04/16/2016 Jen Mow, DO

## 2016-04-19 ENCOUNTER — Encounter (HOSPITAL_COMMUNITY): Payer: Self-pay | Admitting: Obstetrics & Gynecology

## 2016-04-27 ENCOUNTER — Encounter: Payer: Self-pay | Admitting: Internal Medicine

## 2016-04-27 ENCOUNTER — Ambulatory Visit (INDEPENDENT_AMBULATORY_CARE_PROVIDER_SITE_OTHER): Payer: BLUE CROSS/BLUE SHIELD | Admitting: Internal Medicine

## 2016-04-27 DIAGNOSIS — M549 Dorsalgia, unspecified: Secondary | ICD-10-CM

## 2016-04-27 DIAGNOSIS — M546 Pain in thoracic spine: Secondary | ICD-10-CM

## 2016-04-27 NOTE — Patient Instructions (Signed)
Please follow up in 6 weeks for follow up. Your incision site looks great. You can pull off the steri strips as you desire. For your back pain, you will can do back stretches, have someone give you a massage. Take Ibuprofen as needed for pain

## 2016-04-27 NOTE — Progress Notes (Signed)
   [facility] [name], MD Phone: [phone]  Reason For Visit: C/S follow up, incision check; 2 weeks   # Patient is her for a two week incision check of C-section site. Denies any pain, no fever or chills, overall feeling well, no nausea or vomiting. Denies any issues with baby blues or other concerns.  Patient has no complaints other than noting that she has upper back pain in her trapezius.    Past Medical History Reviewed problem list.  Medications- reviewed and updated No additions to family history Social history- patient is a non-smoker  Objective: BP 110/80   Pulse 88   Temp 98.2 F (36.8 C) (Oral)   Ht 5\' 3"  (1.6 m)   Wt 150 lb (68 kg)   SpO2 99%   BMI 26.57 kg/m  Gen: NAD, alert, cooperative with exam Musculoskeletal: upper back pain,no abnormalities noted on inspection, paraspinal muscle tenderness bilaterally, normal ROM, normal strength, 2+ DP pulses.   Skin: incision site non- erythematous, non swelling, no discharge from incision site. Steri-strips still in place, no rashes or lesions Neuro: Strength and sensation grossly intact   Assessment/Plan: See problem based a/p  Cesarean delivery delivered 2 week incision site check, no concerns for infection, healing well  - Counseled patient that she can take steri-strips as she wants  - Patient to follow up for 6 week incision check.  - Ibuprofen as needed for pain, however patient denies having much pain at this point.   Upper back pain Muscle spasms  - provided back stretches and counseling to take tylenol as needed.

## 2016-04-28 DIAGNOSIS — M549 Dorsalgia, unspecified: Secondary | ICD-10-CM | POA: Insufficient documentation

## 2016-04-28 NOTE — Assessment & Plan Note (Signed)
Muscle spasms  - provided back stretches and counseling to take tylenol as needed.

## 2016-04-28 NOTE — Assessment & Plan Note (Signed)
2 week incision site check, no concerns for infection, healing well  - Counseled patient that she can take steri-strips as she wants  - Patient to follow up for 6 week incision check.  - Ibuprofen as needed for pain, however patient denies having much pain at this point.

## 2016-05-08 ENCOUNTER — Ambulatory Visit: Payer: BLUE CROSS/BLUE SHIELD | Admitting: Internal Medicine

## 2016-05-26 ENCOUNTER — Encounter: Payer: Self-pay | Admitting: Internal Medicine

## 2016-05-26 ENCOUNTER — Ambulatory Visit (INDEPENDENT_AMBULATORY_CARE_PROVIDER_SITE_OTHER): Payer: BLUE CROSS/BLUE SHIELD | Admitting: Internal Medicine

## 2016-05-26 NOTE — Progress Notes (Signed)
   [facility] [name], MD Phone: [phone]  Reason For Visit: 6 week follow up for C-section   # No issues with pain. Incision well healed. Patient planning to use condoms for contraception, understands what the other options that are available. Overall feels well, denies any issues with depression or anxiety  ] Past Medical History Reviewed problem list.  Medications- reviewed and updated No additions to family history Social history- patient is a non- smoker  Objective: BP 110/72 (BP Location: Left Arm, Patient Position: Sitting, Cuff Size: Normal)   Pulse 79   Temp 98.3 F (36.8 C) (Oral)   Ht 5\' 3"  (1.6 m)   Wt 149 lb 3.2 oz (67.7 kg)   LMP 04/17/2016 (Approximate)   BMI 26.43 kg/m  Gen: NAD, alert, cooperative with exam MSK: Normal gait and station Skin: scar well healed, no drainage or leakage noted, no erythema  Neuro: Strength and sensation grossly intact  Assessment/Plan: See problem based a/p   Cesarean delivery delivered 6 week follow up, well healed scar. No concern for postpartum depression. Plans to use condoms for contraception.  - No further follow up required

## 2016-05-29 NOTE — Assessment & Plan Note (Addendum)
6 week follow up, well healed scar. No concern for postpartum depression. Plans to use condoms for contraception. Pap smear up-to-date.  - No further follow up required

## 2016-08-18 ENCOUNTER — Ambulatory Visit (INDEPENDENT_AMBULATORY_CARE_PROVIDER_SITE_OTHER): Payer: BLUE CROSS/BLUE SHIELD | Admitting: Emergency Medicine

## 2016-08-18 VITALS — BP 122/72 | HR 74 | Temp 98.2°F | Resp 17 | Ht 63.0 in | Wt 161.0 lb

## 2016-08-18 DIAGNOSIS — M79674 Pain in right toe(s): Secondary | ICD-10-CM

## 2016-08-18 DIAGNOSIS — L600 Ingrowing nail: Secondary | ICD-10-CM | POA: Diagnosis not present

## 2016-08-18 DIAGNOSIS — Z23 Encounter for immunization: Secondary | ICD-10-CM

## 2016-08-18 HISTORY — DX: Ingrowing nail: L60.0

## 2016-08-18 MED ORDER — CEPHALEXIN 500 MG PO CAPS: 500.0000 mg | ORAL_CAPSULE | Freq: Three times a day (TID) | ORAL | 0 refills | Status: AC

## 2016-08-18 NOTE — Patient Instructions (Addendum)
IF you received an x-ray today, you will receive an invoice from [facility]. Please contact [facility] at [phone] with questions or concerns regarding your invoice.   IF you received labwork today, you will receive an invoice from [facility]. Please contact [facility] at [phone] with questions or concerns regarding your invoice.   Our billing staff will not be able to assist you with questions regarding bills from these companies.  You will be contacted with the lab results as soon as they are available. The fastest way to get your results is to activate your My Chart account. Instructions are located on the last page of this paperwork. If you have not heard from us regarding the results in 2 weeks, please contact this office.     Ua del pie encarnada (Ingrown Toenail) La ua del pie encarnada se produce cuando las esquinas o los costados de la ua crecen hacia la piel circundante. Es ms frecuente en el dedo gordo, pero puede ocurrir en cualquier dedo del pie. Si la ua del pie encarnada no se trata, puede correr riesgo de infectarse. CAUSAS Este trastorno puede ser causado por:  Uso de calzado muy pequeo o apretado.  Lesin o traumatismo, por ejemplo, al golpearse el dedo contra algo o si alguien se lo pisa.  Cuidado inadecuado de las uas del pie o uas mal cortadas.  Anomalas presentes desde el nacimiento en las uas o los pies (congnitas), por ejemplo, una ua muy grande para el dedo. FACTORES DE RIESGO [address]ntre los factores de riesgo de tener una ua del pie encarnada, se incluyen los siguientes:  La edad. Las uas tienden a Scientist, product/process developmentengrosarse con el paso del Virginvilletiempo, por lo que las uas encarnadas son ms frecuentes en las personas de Castaicedad avanzada.  Diabetes.  Uas del pie mal cortadas.  Problemas en la circulacin sangunea. SNTOMAS Entre los sntomas se pueden incluir los siguientes:  Inflamacin o dolor y sensibilidad con la  palpacin.  Enrojecimiento.  Hinchazn.  Endurecimiento de la piel alrededor del dedo. Si nota lquido, pus o supuracin, la ua del pie encarnada puede estar infectada. DIAGNSTICO La ua del pie encarnada se puede diagnosticar mediante la historia clnica y un examen fsico. Si la ua est infectada, el mdico puede analizar una muestra del lquido que supura. TRATAMIENTO El tratamiento depende de la gravedad de la ua del pie encarnada. Algunos casos pueden tratarse en casa; otros casos ms graves o en los que la ua se infecta requieren Azerba[place]anciruga para extirpar la ua total o parcialmente. Las uas del pie encarnadas e infectadas tambin pueden tratarse con antibiticos. INSTRUCCIONES PARA EL CUIDADO EN EL HOGAR  Si le recetaron antibiticos, asegrese de terminarlos, incluso si comienza a sentirse mejor.  Remoje el pie en agua tibia jabonosa durante 20minutos, 3veces al da, o como se lo haya indicado el mdico.  Separe con cuidado el borde de la ua de la piel dolorida e introduzca un pequeo trozo de algodn debajo de la esquina de la ua. Esto Teacher, early years/predisminuir el dolor. Tenga cuidado de no lesionar ms el rea.  Use zapatos que calcen bien. En caso de que la ua del pie encarnada le cause dolor, intente usar sandalias, si es posible.  Crtese las uas de los pies con cuidado y de forma regular. No las corte de forma curva. Crtese las uas de los pies en lnea recta, para evitar lesiones en la piel en las esquinas de las uas.  Mantenga los pies limpios y secos.  Si tiene problemas para caminar y Biochemist, clinicalel mdico le da muletas, selas segn las indicaciones.  No se toque la ua del pie ni trate de quitarla por su cuenta.  Tome los medicamentos solamente como se lo haya indicado el mdico.  Concurra a todas las visitas de control como se lo haya indicado el mdico. Esto es importante. SOLICITE ATENCIN MDICA SI:  Los sntomas no mejoran con Scientist, research (medical)el tratamiento. SOLICITE ATENCIN MDICA DE  INMEDIATO SI:  Tiene lneas rojas que comienzan en el pie y continan en la pierna.  Tiene fiebre.  El enrojecimiento, la hinchazn o el dolor Catlettsburgaumentan.  Observa lquido, sangre o pus que sale de la ua del pie. Esta informacin no tiene Theme park managercomo fin reemplazar el consejo del mdico. Asegrese de hacerle al mdico cualquier pregunta que tenga. Document Released: 07/24/2005 Document Revised: 12/08/2014 Document Reviewed: 06/17/2014 Elsevier Interactive Patient Education  2017 [employer]

## 2016-08-18 NOTE — Progress Notes (Signed)
Krystal Dixon 31 y.o.   Chief Complaint  Patient presents with  . Toe Injury    HISTORY OF PRESENT ILLNESS: This is a 31 y.o. female complaining of right big toe pain/swelling x 1 week.  Toe Pain   The incident occurred 5 to 7 days ago. The incident occurred at home. There was no injury mechanism (clipping nail/cuticle). The pain is present in the right foot. The pain is at a severity of 2/10. The pain is mild. The pain has been constant since onset. Pertinent negatives include no inability to bear weight, loss of motion, loss of sensation, numbness or tingling. She reports no foreign bodies present. The symptoms are aggravated by palpation. Treatments tried: topical antibiotic. The treatment provided mild relief.     Prior to Admission medications   Medication Sig Start Date End Date Taking? Authorizing Provider  cephALEXin (KEFLEX) 500 MG capsule Take 1 capsule (500 mg total) by mouth 3 (three) times daily. 08/18/16 08/25/16  Georgina QuintMiguel Jose Aaradhya Kysar, MD    No Known Allergies  Patient Active Problem List   Diagnosis Date Noted  . Upper back pain 04/28/2016  . Cesarean delivery delivered 04/16/2016  . Gestational hypertension 04/12/2016  . Elevated blood pressure reading without diagnosis of hypertension 04/11/2016  . Pruritic rash 02/18/2016  . Encounter for supervision of other normal pregnancy 01/27/2016  . Supervision of normal first pregnancy 09/30/2015  . Nausea and vomiting during pregnancy prior to [redacted] weeks gestation 09/30/2015  . Rash and nonspecific skin eruption 04/14/2013    Past Medical History:  Diagnosis Date  . Medical history non-contributory     Past Surgical History:  Procedure Laterality Date  . CESAREAN SECTION N/A 04/14/2016   Procedure: CESAREAN SECTION;  Surgeon: Lesly DukesKelly H Leggett, MD;  [facility];  Service: Obstetrics;  Laterality: N/A;  . NO PAST SURGERIES    . WISDOM TOOTH EXTRACTION      Social History   Social History   . Marital status: Married    Spouse name: N/A  . Number of children: N/A  . Years of education: N/A   Occupational History  . Not on file.   Social History Main Topics  . Smoking status: Never Smoker  . Smokeless tobacco: Never Used  . Alcohol use No  . Drug use: No  . Sexual activity: Yes   Other Topics Concern  . Not on file   Social History Narrative  . No narrative on file    Family History  Problem Relation Age of Onset  . Diabetes Paternal Grandfather   . Diabetes Paternal Uncle      Review of Systems  Constitutional: Negative for chills and fever.  HENT: Negative.   Eyes: Negative.   Respiratory: Negative.   Cardiovascular: Negative.   Gastrointestinal: Negative for nausea and vomiting.  Musculoskeletal:       Toe pain  Skin: Negative for rash.  Neurological: Negative for tingling and numbness.  Endo/Heme/Allergies: Negative.   All other systems reviewed and are negative.  Vitals:   08/18/16 0823  BP: 122/72  Pulse: 74  Resp: 17  Temp: 98.2 F (36.8 C)     Physical Exam  Constitutional: She is oriented to person, place, and time. She appears well-developed and well-nourished.  HENT:  Head: Normocephalic and atraumatic.  Eyes: Conjunctivae and EOM are normal. Pupils are equal, round, and reactive to light.  Neck: Normal range of motion. Neck supple.  Cardiovascular: Normal rate and regular rhythm.   Pulmonary/Chest: Effort  normal and breath sounds normal.  Musculoskeletal:  Right big toe: +paronychia with slight discharge and mild swelling  Neurological: She is alert and oriented to person, place, and time.  Skin: Skin is warm and dry.  Psychiatric: She has a normal mood and affect.     ASSESSMENT & PLAN: Myiah was seen today for toe injury.  Diagnoses and all orders for this visit:  Need for prophylactic vaccination and inoculation against influenza -     Flu Vaccine QUAD 36+ mos IM  Ingrown nail of great toe of right foot -      Ambulatory referral to Podiatry  Great toe pain, right  Other orders -     cephALEXin (KEFLEX) 500 MG capsule; Take 1 capsule (500 mg total) by mouth 3 (three) times daily.   Patient Instructions       IF you received an x-ray today, you will receive an invoice from [facility]. Please contact [facility] at [phone] with questions or concerns regarding your invoice.   IF you received labwork today, you will receive an invoice from [place]. Please contact [facility] at [phone] with questions or concerns regarding your invoice.   Our billing staff will not be able to assist you with questions regarding bills from these companies.  You will be contacted with the lab results as soon as they are available. The fastest way to get your results is to activate your My Chart account. Instructions are located on the last page of this paperwork. If you have not heard from [place] regarding the results in 2 weeks, please contact this office.     Ua del pie encarnada (Ingrown Toenail) La ua del pie encarnada se produce cuando las esquinas o los costados de la ua crecen hacia la piel circundante. Es ms frecuente en el dedo g[place], pero puede ocurrir en cualquier dedo del pie. Si la ua del pie encarnada no se trata, puede correr riesgo de infectarse. CAUSAS Este trastorno puede ser causado por:  Uso de calzado muy pequeo o apretado.  Lesin o traumatismo, por ejemplo, al golpearse el dedo contra algo o si alguien se lo pisa.  Cuidado inadecuado de las uas del pie o uas mal cortadas.  Anomalas presentes desde el nacimiento en las uas o los pies (congnitas), por ejemplo, una ua muy grande para el dedo. FACTORES DE RIESGO [address] factores de riesgo de tener una ua del pie encarnada, se incluyen los siguientes:  La edad. Las uas tienden a Scientist, product/process development con el paso del [place], por lo que las uas encarnadas son ms frecuentes en las personas de [place].  Diabetes.  Uas del pie mal cortadas.  Problemas en la circulacin sangunea. SNTOMAS Entre los sntomas se pueden incluir los siguientes:  Inflamacin o dolor y sensibilidad con la palpacin.  Enrojecimiento.  Hinchazn.  Endurecimiento de la piel alrededor del dedo. Si nota lquido, pus o supuracin, la ua del pie encarnada puede estar infectada. DIAGNSTICO La ua del pie encarnada se puede diagnosticar mediante la historia clnica y un examen fsico. Si la ua est infectada, el mdico puede analizar una muestra del lquido que supura. TRATAMIENTO El tratamiento depende de la gravedad de la ua del pie encarnada. Algunos casos pueden tratarse en casa; otros casos ms graves o en los que la ua se infecta requieren [place] para extirpar la ua total o parcialmente. Las uas del pie encarnadas e infectadas tambin pueden tratarse con antibiticos. INSTRUCCIONES PARA EL CUIDADO EN EL HOGAR  Si le recetaron antibiticos, asegrese de terminarlos, incluso si comienza a Actor.  Remoje el pie en agua tibia jabonosa durante , 3veces al da, o como se lo haya indicado el mdico.  Separe con cuidado el borde de la ua de la piel dolorida e introduzca un pequeo trozo de algodn debajo de la esquina de la ua. Esto Teacher, early years/pre. Tenga cuidado de no lesionar ms el rea.  Use zapatos que calcen bien. En caso de que la ua del pie encarnada le cause dolor, intente usar sandalias, si es posible.  Crtese las uas de los pies con cuidado y de forma regular. No las corte de forma curva. Crtese las uas de los pies en lnea recta, para evitar lesiones en la piel en las esquinas de las uas.  Mantenga los pies limpios y secos.  Si tiene problemas para caminar y Biochemist, clinical, selas segn las indicaciones.  No se toque la ua del pie ni trate de quitarla por su cuenta.  Tome los medicamentos solamente como se lo haya indicado el  mdico.  Concurra a todas las visitas de control como se lo haya indicado el mdico. Esto es importante. SOLICITE ATENCIN MDICA SI:  Los sntomas no mejoran con Scientist, research (medical). SOLICITE ATENCIN MDICA DE INMEDIATO SI:  Tiene lneas rojas que comienzan en el pie y continan en la pierna.  Tiene fiebre.  El enrojecimiento, la hinchazn o el dolor [place].  Observa lquido, sangre o pus que sale de la ua del pie. Esta informacin no tiene Theme park manager el consejo del mdico. Asegrese de hacerle al mdico cualquier pregunta que tenga. Document Released: 07/24/2005 Document Revised: 12/08/2014 Document Reviewed: 06/17/2014 Elsevier Interactive Patient Education  2017 Elsevier Inc.      Edwina Barth, MD [facility]

## 2016-09-04 ENCOUNTER — Encounter: Payer: Self-pay | Admitting: Podiatry

## 2016-09-04 ENCOUNTER — Ambulatory Visit (INDEPENDENT_AMBULATORY_CARE_PROVIDER_SITE_OTHER): Payer: BLUE CROSS/BLUE SHIELD | Admitting: Podiatry

## 2016-09-04 VITALS — BP 111/77 | HR 78 | Resp 16 | Wt 160.0 lb

## 2016-09-04 DIAGNOSIS — L600 Ingrowing nail: Secondary | ICD-10-CM

## 2016-09-04 NOTE — Patient Instructions (Signed)

## 2016-09-04 NOTE — Progress Notes (Signed)
   Subjective:    Patient ID: Krystal Dixon, female    DOB: 08-09-1985, 31 y.o.   MRN: [id number]  HPI Chief Complaint  Patient presents with  . Nail Problem    Right foot; great toe-both sides; pt stated, "Has had for the past month; saw pus come out of toe 2 days ago"      Review of Systems  All other systems reviewed and are negative.      Objective:   Physical Exam        Assessment & Plan:

## 2016-09-06 NOTE — Progress Notes (Signed)
Subjective:     Patient ID: Krystal OliphantLissette Facchini, female   DOB: 08-14-1985, 31 y.o.   MRN: [id number]  HPI patient presents with painful ingrown toenail deformity right hallux medial border that's making it hard to wear shoe gear and did have drainage which is not present and has distal redness present   Review of Systems  All other systems reviewed and are negative.      Objective:   Physical Exam  Constitutional: She is oriented to person, place, and time.  Cardiovascular: Intact distal pulses.   Musculoskeletal: Normal range of motion.  Neurological: She is oriented to person, place, and time.  Skin: Skin is warm.  Nursing note and vitals reviewed.  neurovascular status intact muscle strength adequate range of motion within normal limits with patient found to have incurvated right hallux nail medial border that's painful when pressed with distal redness and history of drainage. It is localized with no proximal edema erythema drainage noted and patient's found have good digital perfusion and is well oriented 3     Assessment:     Ingrown toenail deformity right hallux medial border with pain    Plan:     H&P condition reviewed and recommended removal of the nail border. Explained procedure and risk and today I infiltrated the right hallux 60 mg like Marcaine mixture remove the border exposed matrix and applied phenol 3 applications 30 seconds followed by alcohol lavage and sterile dressing. Gave instructions on soaks and reappoint

## 2016-11-23 ENCOUNTER — Ambulatory Visit (INDEPENDENT_AMBULATORY_CARE_PROVIDER_SITE_OTHER): Payer: BLUE CROSS/BLUE SHIELD | Admitting: Physician Assistant

## 2016-11-23 VITALS — BP 117/74 | HR 68 | Temp 98.5°F | Resp 16 | Ht 63.0 in | Wt 162.2 lb

## 2016-11-23 DIAGNOSIS — M545 Low back pain, unspecified: Secondary | ICD-10-CM

## 2016-11-23 DIAGNOSIS — N3001 Acute cystitis with hematuria: Secondary | ICD-10-CM | POA: Diagnosis not present

## 2016-11-23 DIAGNOSIS — R1030 Lower abdominal pain, unspecified: Secondary | ICD-10-CM

## 2016-11-23 LAB — POCT URINALYSIS DIP (MANUAL ENTRY)
Bilirubin, UA: NEGATIVE
Glucose, UA: NEGATIVE mg/dL
Ketones, POC UA: NEGATIVE mg/dL
Nitrite, UA: NEGATIVE
Spec Grav, UA: >=1.03 — AB (ref 1.010–1.025)
Urobilinogen, UA: 0.2 U/dL
pH, UA: 5.5 (ref 5.0–8.0)

## 2016-11-23 LAB — POC MICROSCOPIC URINALYSIS (UMFC): Mucus: ABSENT

## 2016-11-23 MED ORDER — NITROFURANTOIN MONOHYD MACRO 100 MG PO CAPS: 100.0000 mg | ORAL_CAPSULE | Freq: Two times a day (BID) | ORAL | 0 refills | Status: DC

## 2016-11-23 MED ORDER — CYCLOBENZAPRINE HCL 10 MG PO TABS: 10.0000 mg | ORAL_TABLET | Freq: Three times a day (TID) | ORAL | 0 refills | Status: DC | PRN

## 2016-11-23 MED ORDER — MELOXICAM 15 MG PO TABS: 15.0000 mg | ORAL_TABLET | Freq: Every day | ORAL | 1 refills | Status: DC

## 2016-11-23 NOTE — Patient Instructions (Addendum)
Please take the entire course of your antibiotic, even if you start feeling better.  Stay well hydrated - drink 2-3 liters of water/day.  Come back if you are not better in 5-7 days.   Use heat on your back.  Low Back Strain Rehab Ask your health care provider which exercises are safe for you. Do exercises exactly as told by your health care provider and adjust them as directed. It is normal to feel mild stretching, pulling, tightness, or discomfort as you do these exercises, but you should stop right away if you feel sudden pain or your pain gets worse. Do not begin these exercises until told by your health care provider. Stretching and range of motion exercises These exercises warm up your muscles and joints and improve the movement and flexibility of your back. These exercises also help to relieve pain, numbness, and tingling. Exercise A: Single knee to chest   1. Lie on your back on a firm surface with both legs straight. 2. Bend one of your knees. Use your hands to move your knee up toward your chest until you feel a gentle stretch in your lower back and buttock.  Hold your leg in this position by holding onto the front of your knee.  Keep your other leg as straight as possible. 3. Hold for __________ seconds. 4. Slowly return to the starting position. 5. Repeat with your other leg. Repeat __________ times. Complete this exercise __________ times a day. Exercise B: Prone extension on elbows   1. Lie on your abdomen on a firm surface. 2. Prop yourself up on your elbows. 3. Use your arms to help lift your chest up until you feel a gentle stretch in your abdomen and your lower back.  This will place some of your body weight on your elbows. If this is uncomfortable, try stacking pillows under your chest.  Your hips should stay down, against the surface that you are lying on. Keep your hip and back muscles relaxed. 4. Hold for __________ seconds. 5. Slowly relax your upper body and  return to the starting position. Repeat __________ times. Complete this exercise __________ times a day. Strengthening exercises These exercises build strength and endurance in your back. Endurance is the ability to use your muscles for a long time, even after they get tired. Exercise C: Pelvic tilt  1. Lie on your back on a firm surface. Bend your knees and keep your feet flat. 2. Tense your abdominal muscles. Tip your pelvis up toward the ceiling and flatten your lower back into the floor.  To help with this exercise, you may place a small towel under your lower back and try to push your back into the towel. 3. Hold for __________ seconds. 4. Let your muscles relax completely before you repeat this exercise. Repeat __________ times. Complete this exercise __________ times a day. Exercise D: Alternating arm and leg raises   1. Get on your hands and knees on a firm surface. If you are on a hard floor, you may want to use padding to cushion your knees, such as an exercise mat. 2. Line up your arms and legs. Your hands should be below your shoulders, and your knees should be below your hips. 3. Lift your left leg behind you. At the same time, raise your right arm and straighten it in front of you.  Do not lift your leg higher than your hip.  Do not lift your arm higher than your shoulder.  Keep your  abdominal and back muscles tight.  Keep your hips facing the ground.  Do not arch your back.  Keep your balance carefully, and do not hold your breath. 4. Hold for __________ seconds. 5. Slowly return to the starting position and repeat with your right leg and your left arm. Repeat __________ times. Complete this exercise __________times a day. Exercise J: Single leg lower with bent knees  1. Lie on your back on a firm surface. 2. Tense your abdominal muscles and lift your feet off the floor, one foot at a time, so your knees and hips are bent in an "L" shape (at about 90 degrees).  Your  knees should be over your hips and your lower legs should be parallel to the floor. 3. Keeping your abdominal muscles tense and your knee bent, slowly lower one of your legs so your toe touches the ground. 4. Lift your leg back up to return to the starting position.  Do not hold your breath.  Do not let your back arch. Keep your back flat against the ground. 5. Repeat with your other leg. Repeat __________ times. Complete this exercise __________ times a day. Posture and body mechanics   Body mechanics refers to the movements and positions of your body while you do your daily activities. Posture is part of body mechanics. Good posture and healthy body mechanics can help to relieve stress in your body's tissues and joints. Good posture means that your spine is in its natural S-curve position (your spine is neutral), your shoulders are pulled back slightly, and your head is not tipped forward. The following are general guidelines for applying improved posture and body mechanics to your everyday activities. Standing    When standing, keep your spine neutral and your feet about hip-width apart. Keep a slight bend in your knees. Your ears, shoulders, and hips should line up.  When you do a task in which you stand in one place for a long time, place one foot up on a stable object that is 2-4 inches (5-10 cm) high, such as a footstool. This helps keep your spine neutral. Sitting    When sitting, keep your spine neutral and keep your feet flat on the floor. Use a footrest, if necessary, and keep your thighs parallel to the floor. Avoid rounding your shoulders, and avoid tilting your head forward.  When working at a desk or a computer, keep your desk at a height where your hands are slightly lower than your elbows. Slide your chair under your desk so you are close enough to maintain good posture.  When working at a computer, place your monitor at a height where you are looking straight ahead and you  do not have to tilt your head forward or downward to look at the screen. Resting    When lying down and resting, avoid positions that are most painful for you.  If you have pain with activities such as sitting, bending, stooping, or squatting (flexion-based activities), lie in a position in which your body does not bend very much. For example, avoid curling up on your side with your arms and knees near your chest (fetal position).  If you have pain with activities such as standing for a long time or reaching with your arms (extension-based activities), lie with your spine in a neutral position and bend your knees slightly. Try the following positions:  Lying on your side with a pillow between your knees.  Lying on your back with a pillow  under your knees. Lifting    When lifting objects, keep your feet at least shoulder-width apart and tighten your abdominal muscles.  Bend your knees and hips and keep your spine neutral. It is important to lift using the strength of your legs, not your back. Do not lock your knees straight out.  Always ask for help to lift heavy or awkward objects. This information is not intended to replace advice given to you by your health care provider. Make sure you discuss any questions you have with your health care provider. Document Released: 07/24/2005 Document Revised: 03/30/2016 Document Reviewed: 05/05/2015 Elsevier Interactive Patient Education  2017 [employer].   IF you received an x-ray today, you will receive an invoice from [facility]. Please contact [facility] at [phone] with questions or concerns regarding your invoice.   IF you received labwork today, you will receive an invoice from [place]. Please contact [facility] at [phone] with questions or concerns regarding your invoice.   Our billing staff will not be able to assist you with questions regarding bills from these companies.  You will be contacted with the lab  results as soon as they are available. The fastest way to get your results is to activate your My Chart account. Instructions are located on the last page of this paperwork. If you have not heard from [place] regarding the results in 2 weeks, please contact this office.

## 2016-11-23 NOTE — Progress Notes (Signed)
Krystal Dixon  MRN: [id number] DOB: 1986/06/07  PCP: No PCP Per Patient  Subjective:  Pt is a 31 year old female who presents to clinic for low back pain x 2 days.  She was putting her baby in the crib when she felt sudden spasm in her low back. Pain is constant. Is worse with bending.  She has not taken anything to feel better.  Denies flank pain, abdominal pain, blood in urine, fever, chills, burning with urination, increased urinary frequency, increased urgency.   Review of Systems  Constitutional: Negative for chills, fatigue and fever.  Respiratory: Negative for cough, shortness of breath and wheezing.   Cardiovascular: Negative for chest pain and palpitations.  Gastrointestinal: Negative for abdominal pain, diarrhea, nausea and vomiting.  Genitourinary: Negative for decreased urine volume, difficulty urinating, dysuria, enuresis, flank pain, frequency, hematuria and urgency.  Musculoskeletal: Positive for back pain.  Neurological: Negative for weakness, light-headedness and numbness.    Patient Active Problem List   Diagnosis Date Noted  . Ingrown nail of great toe of right foot 08/18/2016  . Gestational hypertension 04/12/2016  . Elevated blood pressure reading without diagnosis of hypertension 04/11/2016    No current outpatient prescriptions on file prior to visit.   No current facility-administered medications on file prior to visit.     No Known Allergies   Objective:  BP 117/74   Pulse 68   Temp 98.5 F (36.9 C) (Oral)   Resp 16   Ht  (1.6 m)   Wt 162 lb 3.2 oz (73.6 kg)   SpO2 97%   BMI 28.73 kg/m   Physical Exam  Constitutional: She is oriented to person, place, and time and well-developed, well-nourished, and in no distress. No distress.  Cardiovascular: Normal rate, regular rhythm and normal heart sounds.   Abdominal: Soft. Normal appearance and bowel sounds are normal. There is tenderness in the periumbilical area. There is no CVA  tenderness.  Musculoskeletal:       Lumbar back: She exhibits tenderness (bilateral). She exhibits no spasm.  Neurological: She is alert and oriented to person, place, and time. GCS score is 15.  Skin: Skin is warm and dry.  Psychiatric: Mood, memory, affect and judgment normal.  Vitals reviewed.  Results for orders placed or performed in visit on 11/23/16  Urine culture  Result Value Ref Range   Urine Culture, Routine Final report    Urine Culture result 1 Comment   POCT Microscopic Urinalysis ([facility])  Result Value Ref Range   WBC,UR,HPF,POC Too numerous to count  (A) None WBC/hpf   RBC,UR,HPF,POC None None RBC/hpf   Bacteria Too numerous to count  None, Too numerous to count   Mucus Absent Absent   Epithelial Cells, UR Per Microscopy Many (A) None, Too numerous to count cells/hpf  POCT urinalysis dipstick  Result Value Ref Range   Color, UA yellow yellow   Clarity, UA cloudy (A) clear   Glucose, UA negative negative mg/dL   Bilirubin, UA negative negative   Ketones, POC UA negative negative mg/dL   Spec Grav, UA >=7.829 (A) 1.010 - 1.025   Blood, UA trace-intact (A) negative   pH, UA 5.5 5.0 - 8.0   Protein Ur, POC trace (A) negative mg/dL   Urobilinogen, UA 0.2 0.2 or 1.0 E.U./dL   Nitrite, UA Negative Negative   Leukocytes, UA Large (3+) (A) Negative    Assessment and Plan :  1. Lower abdominal pain - Urine culture - POCT Microscopic Urinalysis (  [facility]) - POCT urinalysis dipstick  2. Acute bilateral low back pain without sciatica - cyclobenzaprine (FLEXERIL) 10 MG tablet; Take 1 tablet (10 mg total) by mouth 3 (three) times daily as needed for muscle spasms.  Dispense: 30 tablet; Refill: 0 - meloxicam (MOBIC) 15 MG tablet; Take 1 tablet (15 mg total) by mouth daily.  Dispense: 30 tablet; Refill: 1 - Stretches printed out and discussed with pt. Encouraged light exercises, heat/ice and hydration. RTC in 3-4 weeks if no improvement.   3. Acute cystitis with hematuria -  nitrofurantoin, macrocrystal-monohydrate, (MACROBID) 100 MG capsule; Take 1 capsule (100 mg total) by mouth 2 (two) times daily.  Dispense: 20 capsule; Refill: 0 - Encouraged pt to stay well hydrated. RTC in 5-7 days if no improvement - sooner if condition worsens.    Marco Collie, PA-C  [facility] at [facility] 11/23/2016 3:39 PM

## 2016-11-25 LAB — URINE CULTURE

## 2016-12-14 ENCOUNTER — Telehealth: Payer: Self-pay | Admitting: General Practice

## 2016-12-14 NOTE — Telephone Encounter (Signed)
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  She is not sure which visit it is for.  I gave her the number for billing but it may need to be corrected on our side, so just FYI, thanks.

## 2018-08-13 ENCOUNTER — Other Ambulatory Visit: Payer: Self-pay

## 2018-08-13 ENCOUNTER — Encounter: Payer: Self-pay | Admitting: Family Medicine

## 2018-08-13 ENCOUNTER — Ambulatory Visit: Payer: BLUE CROSS/BLUE SHIELD | Admitting: Family Medicine

## 2018-08-13 VITALS — BP 113/75 | HR 80 | Temp 98.7°F | Ht 63.0 in | Wt 165.0 lb

## 2018-08-13 DIAGNOSIS — N926 Irregular menstruation, unspecified: Secondary | ICD-10-CM

## 2018-08-13 DIAGNOSIS — Z23 Encounter for immunization: Secondary | ICD-10-CM | POA: Diagnosis not present

## 2018-08-13 DIAGNOSIS — R102 Pelvic and perineal pain: Secondary | ICD-10-CM | POA: Diagnosis not present

## 2018-08-13 LAB — POCT URINE PREGNANCY: Preg Test, Ur: NEGATIVE

## 2018-08-13 LAB — POC MICROSCOPIC URINALYSIS (UMFC): Mucus: ABSENT

## 2018-08-13 LAB — POCT URINALYSIS DIP (MANUAL ENTRY)
Bilirubin, UA: NEGATIVE
Glucose, UA: NEGATIVE mg/dL
Ketones, POC UA: NEGATIVE mg/dL
Nitrite, UA: NEGATIVE
Protein Ur, POC: NEGATIVE mg/dL
Spec Grav, UA: 1.02 (ref 1.010–1.025)
Urobilinogen, UA: 0.2 E.U./dL
pH, UA: 7 (ref 5.0–8.0)

## 2018-08-13 NOTE — Patient Instructions (Signed)
° ° ° °  If you have lab work done today you will be contacted with your lab results within the next 2 weeks.  If you have not heard from us then please contact us. The fastest way to get your results is to register for My Chart. ° ° °IF you received an x-ray today, you will receive an invoice from [facility]. Please contact [facility] at [phone] with questions or concerns regarding your invoice.  ° °IF you received labwork today, you will receive an invoice from [facility]. Please contact [facility] at [phone] with questions or concerns regarding your invoice.  ° °Our billing staff will not be able to assist you with questions regarding bills from these companies. ° °You will be contacted with the lab results as soon as they are available. The fastest way to get your results is to activate your My Chart account. Instructions are located on the last page of this paperwork. If you have not heard from us regarding the results in 2 weeks, please contact this office. °  ° ° ° °

## 2018-08-13 NOTE — Progress Notes (Signed)
1/7/20202:30 PM  Krystal Dixon [birth year], 33 y.o. female [id number]  Chief Complaint  Patient presents with  . Rash    right side of face. Using hydrocortisone for the itching  . Pelvic Pain    for the past month having pain in the pelvin on the right side. Taking tylenol for the pain    HPI:   Patient is a 33 y.o. female who presents today with several concerns  Rash on face after using new lotion Has stopped since Has been using hydrocortisone OTC to relive itchiness Overall getting better  Having pelvic pain for past several months Right > Left Menses started yesterday Skipped month of December Overall feels menses is getting shorter Home preg test neg No sign vaginal discharge Using condoms No dyspareunia but sometimes day after feels burning Urinating well, denies any symptoms Last pap 10/2015 - normal Denies any issues with constipation G2P1, h/o 1T SAB   Fall Risk  08/13/2018 11/23/2016 08/18/2016 04/11/2016 03/09/2016  Falls in the past year? 0 No No No No     Depression screen [facility] 2/9 08/13/2018 11/23/2016 08/18/2016  Decreased Interest 0 0 0  Down, Depressed, Hopeless 0 0 0  PHQ - 2 Score 0 0 0    No Known Allergies  Prior to Admission medications   Not on File    Past Medical History:  Diagnosis Date  . Medical history non-contributory     Past Surgical History:  Procedure Laterality Date  . CESAREAN SECTION N/A 04/14/2016   Procedure: CESAREAN SECTION;  Surgeon: Lesly Dukes, MD;  Location: [facility] BIRTHING SUITES;  Service: Obstetrics;  Laterality: N/A;  . NO PAST SURGERIES    . WISDOM TOOTH EXTRACTION      Social History   Tobacco Use  . Smoking status: Never Smoker  . Smokeless tobacco: Never Used  Substance Use Topics  . Alcohol use: No    Alcohol/week: 0.0 standard drinks    Family History  Problem Relation Age of Onset  . Diabetes Paternal Grandfather   . Diabetes Paternal Uncle     ROS Per hpi  OBJECTIVE:  Blood  pressure 113/75, pulse 80, temperature 98.7 F (37.1 C), temperature source Oral, height 5\' 3"  (1.6 m), weight 165 lb (74.8 kg), last menstrual period 08/13/2018, SpO2 100 %. Body mass index is 29.23 kg/m.   Physical Exam Vitals signs and nursing note reviewed.  Constitutional:      Appearance: She is well-developed.  HENT:     Head: Normocephalic and atraumatic.  Eyes:     General: No scleral icterus.    Conjunctiva/sclera: Conjunctivae normal.     Pupils: Pupils are equal, round, and reactive to light.  Neck:     Musculoskeletal: Neck supple.  Pulmonary:     Effort: Pulmonary effort is normal.  Abdominal:     General: Bowel sounds are normal. There is no distension.     Palpations: Abdomen is soft. There is no mass.     Tenderness: There is abdominal tenderness (lower abdomen). There is no guarding or rebound.  Skin:    General: Skin is warm and dry.  Neurological:     Mental Status: She is alert and oriented to person, place, and time.    patient declines pelvic exam as she is on her menses   Results for orders placed or performed in visit on 08/13/18 (from the past 24 hour(s))  POCT urinalysis dipstick     Status: Abnormal   Collection Time: 08/13/18  2:01 PM  Result Value Ref Range   Color, UA yellow yellow   Clarity, UA cloudy (A) clear   Glucose, UA negative negative mg/dL   Bilirubin, UA negative negative   Ketones, POC UA negative negative mg/dL   Spec Grav, UA 7.408 1.448 - 1.025   Blood, UA large (A) negative   pH, UA 7.0 5.0 - 8.0   Protein Ur, POC negative negative mg/dL   Urobilinogen, UA 0.2 0.2 or 1.0 E.U./dL   Nitrite, UA Negative Negative   Leukocytes, UA Small (1+) (A) Negative  POCT urine pregnancy     Status: None   Collection Time: 08/13/18  2:36 PM  Result Value Ref Range   Preg Test, Ur Negative Negative  POCT Microscopic Urinalysis ([facility])     Status: Abnormal   Collection Time: 08/13/18  2:37 PM  Result Value Ref Range   WBC,UR,HPF,POC  Few (A) None WBC/hpf   RBC,UR,HPF,POC Too numerous to count  (A) None RBC/hpf   Bacteria Few (A) None, Too numerous to count   Mucus Absent Absent   Epithelial Cells, UR Per Microscopy Moderate (A) None, Too numerous to count cells/hpf  on menses    ASSESSMENT and PLAN  1. Pelvic pain Neg preg test. [place] to r/o ovarian cyst, fibroids, etc. Pelvic exam at next visit for cultures if needed.  - POCT urinalysis dipstick - POCT urine pregnancy - POCT Microscopic Urinalysis ([facility]) - US Pelvic Complete With Transvaginal; Future  2. Irregular menstrual cycle - TSH  3. Need for prophylactic vaccination and inoculation against influenza - Flu Vaccine QUAD 36+ mos IM    Return for after ultrasound.    Myles Lipps, MD Primary Care at [facility] [address] [place], [place] [address] Ph.  [phone] Fax [phone]

## 2018-08-14 LAB — TSH: TSH: 1.75 u[IU]/mL (ref 0.450–4.500)

## 2018-08-29 ENCOUNTER — Ambulatory Visit (HOSPITAL_COMMUNITY): Payer: BLUE CROSS/BLUE SHIELD

## 2020-08-29 ENCOUNTER — Other Ambulatory Visit: Payer: Self-pay

## 2020-08-29 ENCOUNTER — Inpatient Hospital Stay (HOSPITAL_COMMUNITY)
Admission: EM | Admit: 2020-08-29 | Discharge: 2020-08-29 | Disposition: A | Discharge status: Home / Self Care | Payer: Self-pay | Attending: Family Medicine | Admitting: Family Medicine

## 2020-08-29 ENCOUNTER — Encounter (HOSPITAL_COMMUNITY): Payer: Self-pay | Admitting: Family Medicine

## 2020-08-29 ENCOUNTER — Inpatient Hospital Stay (HOSPITAL_COMMUNITY): Payer: Self-pay

## 2020-08-29 DIAGNOSIS — Z3491 Encounter for supervision of normal pregnancy, unspecified, first trimester: Secondary | ICD-10-CM

## 2020-08-29 DIAGNOSIS — O2311 Infections of bladder in pregnancy, first trimester: Secondary | ICD-10-CM | POA: Insufficient documentation

## 2020-08-29 DIAGNOSIS — R1030 Lower abdominal pain, unspecified: Secondary | ICD-10-CM | POA: Insufficient documentation

## 2020-08-29 DIAGNOSIS — Z3A08 8 weeks gestation of pregnancy: Secondary | ICD-10-CM | POA: Insufficient documentation

## 2020-08-29 DIAGNOSIS — N3090 Cystitis, unspecified without hematuria: Secondary | ICD-10-CM | POA: Insufficient documentation

## 2020-08-29 DIAGNOSIS — O26899 Other specified pregnancy related conditions, unspecified trimester: Secondary | ICD-10-CM

## 2020-08-29 LAB — URINALYSIS, ROUTINE W REFLEX MICROSCOPIC
Bacteria, UA: NONE SEEN
Bilirubin Urine: NEGATIVE
Glucose, UA: NEGATIVE mg/dL
Hgb urine dipstick: NEGATIVE
Ketones, ur: NEGATIVE mg/dL
Nitrite: NEGATIVE
Protein, ur: NEGATIVE mg/dL
Specific Gravity, Urine: 1.027 (ref 1.005–1.030)
pH: 5 (ref 5.0–8.0)

## 2020-08-29 LAB — HCG, QUANTITATIVE, PREGNANCY: hCG, Beta Chain, Quant, S: 30500 m[IU]/mL — ABNORMAL HIGH (ref <5)

## 2020-08-29 LAB — CBC
HCT: 38.2 % (ref 36.0–46.0)
Hemoglobin: 12.9 g/dL (ref 12.0–15.0)
MCH: 27.1 pg (ref 26.0–34.0)
MCHC: 33.8 g/dL (ref 30.0–36.0)
MCV: 80.3 fL (ref 80.0–100.0)
Platelets: 295 10*3/uL (ref 150–400)
RBC: 4.76 MIL/uL (ref 3.87–5.11)
RDW: 12.9 % (ref 11.5–15.5)
WBC: 9.7 10*3/uL (ref 4.0–10.5)
nRBC: 0 % (ref 0.0–0.2)

## 2020-08-29 LAB — POC URINE PREG, ED: Preg Test, Ur: POSITIVE — AB

## 2020-08-29 LAB — WET PREP, GENITAL
Clue Cells Wet Prep HPF POC: NONE SEEN
Sperm: NONE SEEN
Trich, Wet Prep: NONE SEEN
Yeast Wet Prep HPF POC: NONE SEEN

## 2020-08-29 MED ORDER — CEFADROXIL 500 MG PO CAPS: 500.0000 mg | ORAL_CAPSULE | Freq: Two times a day (BID) | ORAL | 0 refills | Status: AC

## 2020-08-29 MED ORDER — PHENAZOPYRIDINE HCL 200 MG PO TABS: 200.0000 mg | ORAL_TABLET | Freq: Three times a day (TID) | ORAL | 0 refills | Status: AC

## 2020-08-29 NOTE — MAU Note (Signed)
Pt is  G3)2 at 8 weeks per LMP, c/o discharge that is white white and chunky with no smell, pelvic pain of a 5/10 intermittently and pain with urination.  No bleeding reported.

## 2020-08-29 NOTE — Discharge Instructions (Signed)
Prenatal Care Providers           [facility]  [address] [phone]  [facility]   [address]  [phone]  [facility] [place] [facility]       [address] [phone]            [facility] [place]     [address] [phone]          [facility] [place]   [address] [phone]  [facility]  [address] [phone]     [facility])  [address]   [phone]     [facility]  Phone: [phone]  [facility]  Phone: [phone]  [facility] Phone: [phone]  [facility] Phone: [phone]  [facility] Phone: [phone]  [facility] Phone: [phone]  [facility]  Phone: [phone]   Las medicinas seguras para tomar durante el embarazo  Safe Medications in Pregnancy  Acn:  Benzoyl Peroxide (Perxido de benzolo)  Salicylic Acid (cido saliclico)  Dolor de espalda/Dolor de cabeza:  Tylenol: 2 pastillas de concentracin regular cada 4 horas O 2 pastillas de concentracin fuerte cada 6 horas  Resfriados/Tos/Alergias:  Benadryl (sin alcohol) 25 mg cada 6 horas segn lo necesite Breath Right strips (Tiras para respirar correctamente)  Claritin  Cepacol (pastillas de chupar para la garganta)  Chloraseptic (aerosol para la garganta)  Cold-Eeze- hasta tres veces por da  Cough drops (pastillas de chupar para la tos, sin alcohol)  Flonase (con receta mdica solamente)  Guaifenesin  Mucinex  Robitussin DM (simple solamente, sin alcohol)  Saline nasal spray/drops (Aerosol nasal salino/gotas) Sudafed (pseudoephedrine) y  Actifed * utilizar slo despus de 12  semanas de gestacin y si no tiene la presin arterial alta.  Tylenol Vicks  VapoRub  Zinc lozenges (pastillas para la garganta)  Zyrtec  Estreimiento:  Colace  Ducolax (supositorios)  Fleet enema (lavado intestinal rectal)  Glycerin (supositorios)  Metamucil  Milk of magnesia (leche de magnesia)  Miralax  Senokot  Smooth Move (t)  Diarrea:  Kaopectate Imodium A-D  *NO tome Pepto-Bismol  Hemorroides:  Anusol  Anusol HC  Preparation H  Tucks  Indigestin:  Tums  Maalox  Mylanta  Zantac  Pepcid  Insomnia:  Benadryl (sin alcohol) 25mg cada 6 horas segn lo necesite  Tylenol PM  Unisom, no Gelcaps  Calambres en las piernas:  Tums  MagGel Nuseas/Vmitos:  Bonine  Dramamine  Emetrol  Ginger (extracto)  Sea-Bands  Meclizine  Medicina para las nuseas que puede tomar durante el embarazo: Unisom (doxylamine succinate, pastillas de 25 mg) Tome una pastilla al da al acostarse. Si los sntomas no estn adecuadamente controlados, la dosis puede aumentarse hasta una dosis mxima recomendada de dos pastillas al da (1/2 pastilla por la maana, 1/2 pastilla a media tarde y una pastilla al acostarse). Pastillas de Vitamina B6 de 100mg. Tome una pastilla dos veces al da (hasta 200 mg por da).  Erupciones en la piel:  Productos de Aveeno  Benadryl cream (crema o una dosis de 25mg cada 6 horas segn lo necesite)  Calamine Lotion (locin)  1% cortisone cream (crema de cortisona de 1%)  nfeccin vaginal por hongos (candidiasis):  Gyne-lotrimin   7  Monistat 7   **Si est tomando varias medicinas, por favor revise las etiquetas para evitar duplicar los mismos ingredientes activos. **Tome la medicina segn lo indicado en la etiqueta. **No tome ms de 400 mg de Tylenol en 24 horas. **No tome medicinas que contengan aspirina o ibuprofeno.     

## 2020-08-29 NOTE — ED Triage Notes (Signed)
Emergency Medicine Provider OB Triage Evaluation Note  Krystal Dixon is a 35 y.o. female who is approximately [redacted] weeks pregnant presenting to the ED with intermittent suprapubic pain x 3 days. Also has some dysuria and some white vaginal discharge  Review of  Systems  Positive: pelvic pain, dysuria, vaginal discharge.  Negative: fever, chills, vaginal bleeding  Physical Exam  BP 122/80   Pulse 72   Temp 98.3 F (36.8 C)   Resp 16   SpO2 99%  General: Awake, no distress  HEENT: Atraumatic  Resp: Normal effort  Cardiac: Normal rate Abd: Nondistended, nontender  MSK: Moves all extremities without difficulty Neuro: Speech clear  Medical Decision Making  Pt evaluated for pregnancy concern and is stable for transfer to MAU. Pt is in agreement with plan for transfer.  11:53 AM Discussed with MAU APP, Rayfield Citizen, who accepts patient in transfer.  Clinical Impression   1. Abdominal pain during pregnancy in first trimester        Cherly Anderson, PA-C 08/29/20 1204

## 2020-08-29 NOTE — MAU Provider Note (Signed)
History     CSN: [id number]  Arrival date and time: 08/29/20 1048   Event Date/Time   First Provider Initiated Contact with Patient 08/29/20 1355      Chief Complaint  Patient presents with  . Vaginal Discharge   HPI Krystal Dixon is a 35 y.o. G3P1011 at [redacted]w[redacted]d who presents with lower abdominal pain and dysuria. She states she started having some cramping this week that she rates a 4/10. She also reports that it burns every time she urinates. She reports some creamy white discharge but no itching, burning or vaginal bleeding.   OB History    Gravida  3   Para  1   Term  1   Preterm  0   AB  1   Living  1     SAB  1   IAB  0   Ectopic  0   Multiple  0   Live Births  1           Past Medical History:  Diagnosis Date  . Medical history non-contributory     Past Surgical History:  Procedure Laterality Date  . CESAREAN SECTION N/A 04/14/2016   Procedure: CESAREAN SECTION;  Surgeon: Lesly Dukes, MD;  Location: [facility] BIRTHING SUIT[facility];  Service: Obstetrics;  Laterality: N/A;  . NO PAST SURGERIES    . WISDOM TOOTH EXTRACTION      Family History  Problem Relation Age of Onset  . Diabetes Paternal Grandfather   . Diabetes Paternal Uncle     Social History   Tobacco Use  . Smoking status: Never Smoker  . Smokeless tobacco: Never Used  Substance Use Topics  . Alcohol use: No    Alcohol/week: 0.0 standard drinks  . Drug use: No    Allergies: No Known Allergies  No medications prior to admission.    Review of Systems  Constitutional: Negative.  Negative for fatigue and fever.  HENT: Negative.   Respiratory: Negative.  Negative for shortness of breath.   Cardiovascular: Negative.  Negative for chest pain.  Gastrointestinal: Positive for abdominal pain. Negative for constipation, diarrhea, nausea and vomiting.  Genitourinary: Positive for dysuria and vaginal discharge. Negative for vaginal bleeding.  Neurological: Negative.  Negative for  dizziness and headaches.   Physical Exam   Blood pressure 112/72, pulse 66, temperature 98.3 F (36.8 C), resp. rate 16, last menstrual period 07/01/2020, SpO2 99 %.  Physical Exam Vitals and nursing note reviewed.  Constitutional:      General: She is not in acute distress.    Appearance: She is well-developed and well-nourished.  HENT:     Head: Normocephalic.  Eyes:     Pupils: Pupils are equal, round, and reactive to light.  Cardiovascular:     Rate and Rhythm: Normal rate and regular rhythm.     Heart sounds: Normal heart sounds.  Pulmonary:     Effort: Pulmonary effort is normal. No respiratory distress.     Breath sounds: Normal breath sounds.  Abdominal:     General: Bowel sounds are normal. There is no distension.     Palpations: Abdomen is soft.     Tenderness: There is no abdominal tenderness.  Skin:    General: Skin is warm and dry.  Neurological:     Mental Status: She is alert and oriented to person, place, and time.  Psychiatric:        Mood and Affect: Mood and affect normal.        Behavior: Behavior  normal.        Thought Content: Thought content normal.        Judgment: Judgment normal.     MAU Course  Procedures Results for orders placed or performed during the hospital encounter of 08/29/20 (from the past 24 hour(s))  POC Urine Pregnancy, ED (not at [facility])     Status: Abnormal   Collection Time: 08/29/20 11:15 AM  Result Value Ref Range   Preg Test, Ur POSITIVE (A) NEGATIVE  CBC     Status: None   Collection Time: 08/29/20 12:22 PM  Result Value Ref Range   WBC 9.7 4.0 - 10.5 K/uL   RBC 4.76 3.87 - 5.11 MIL/uL   Hemoglobin 12.9 12.0 - 15.0 g/dL   HCT 90.3 00.9 - 23.3 %   MCV 80.3 80.0 - 100.0 fL   MCH 27.1 26.0 - 34.0 pg   MCHC 33.8 30.0 - 36.0 g/dL   RDW 00.7 62.2 - 63.3 %   Platelets 295 150 - 400 K/uL   nRBC 0.0 0.0 - 0.2 %  hCG, quantitative, pregnancy     Status: Abnormal   Collection Time: 08/29/20 12:22 PM  Result Value Ref Range    hCG, Beta Chain, Quant, S 30,500 (H) <5 mIU/mL  Urinalysis, Routine w reflex microscopic     Status: Abnormal   Collection Time: 08/29/20  1:49 PM  Result Value Ref Range   Color, Urine YELLOW YELLOW   APPearance HAZY (A) CLEAR   Specific Gravity, Urine 1.027 1.005 - 1.030   pH 5.0 5.0 - 8.0   Glucose, UA NEGATIVE NEGATIVE mg/dL   Hgb urine dipstick NEGATIVE NEGATIVE   Bilirubin Urine NEGATIVE NEGATIVE   Ketones, ur NEGATIVE NEGATIVE mg/dL   Protein, ur NEGATIVE NEGATIVE mg/dL   Nitrite NEGATIVE NEGATIVE   Leukocytes,Ua LARGE (A) NEGATIVE   RBC / HPF 0-5 0 - 5 RBC/hpf   WBC, UA 21-50 0 - 5 WBC/hpf   Bacteria, UA NONE SEEN NONE SEEN   Squamous Epithelial / LPF 0-5 0 - 5   Mucus PRESENT   Wet prep, genital     Status: Abnormal   Collection Time: 08/29/20  1:49 PM  Result Value Ref Range   Yeast Wet Prep HPF POC NONE SEEN NONE SEEN   Trich, Wet Prep NONE SEEN NONE SEEN   Clue Cells Wet Prep HPF POC NONE SEEN NONE SEEN   WBC, Wet Prep HPF POC MANY (A) NONE SEEN   Sperm NONE SEEN    US OB LESS THAN 14 WEEKS WITH OB TRANSVAGINAL  Result Date: 08/29/2020 CLINICAL DATA:  Pregnant patient with abdominal pain. EXAM: OBSTETRIC <14 WK [place] AND TRANSVAGINAL OB US TECHNIQUE: Both transabdominal and transvaginal ultrasound examinations were performed for complete evaluation of the gestation as well as the maternal uterus, adnexal regions, and pelvic cul-de-sac. Transvaginal technique was performed to assess early pregnancy. COMPARISON:  None. FINDINGS: Intrauterine gestational sac: Single Yolk sac:  Visualized. Embryo:  Visualized. Cardiac Activity: Visualized. Heart Rate: 106 bpm CRL:  4.6 mm   6 w   1 d                  [place] EDC: 04/23/2021 Subchorionic hemorrhage:  None visualized. Maternal uterus/adnexae: Normal right and left ovaries. No free fluid in the pelvis. IMPRESSION: Single live intrauterine gestation.  No subchorionic hemorrhage. Electronically Signed   By: Annia Belt M.D.   On:  08/29/2020 14:27    MDM UA, UPT CBC, HCG ABO/Rh- O  Pos Wet prep and gc/chlamydia US OB Comp Less 14 weeks with Transvaginal  Will prophylactically treat for UTI due to symptoms and large leukocytes in urine. Will send for culture as well.  Assessment and Plan   1. Normal intrauterine pregnancy on prenatal ultrasound in first trimester   2. Abdominal pain affecting pregnancy   3. [redacted] weeks gestation of pregnancy   4. Cystitis during pregnancy in first trimester, antepartum    -Discharge home in stable condition -Rx for duricef sent to patient's pharmacy -First trimester precautions discussed -Patient advised to follow-up with OB to establish prenatal care -Patient may return to MAU as needed or if her condition were to change or worsen   Rolm Bookbinder CNM 08/29/2020, 1:55 PM

## 2020-08-30 LAB — GC/CHLAMYDIA PROBE AMP (~~LOC~~) NOT AT ARMC
Chlamydia: NEGATIVE
Comment: NEGATIVE
Comment: NORMAL
Neisseria Gonorrhea: NEGATIVE

## 2020-09-01 LAB — CULTURE, OB URINE: Culture: NO GROWTH

## 2020-09-18 ENCOUNTER — Other Ambulatory Visit: Payer: Self-pay

## 2020-09-18 ENCOUNTER — Encounter (HOSPITAL_COMMUNITY): Payer: Self-pay | Admitting: Obstetrics and Gynecology

## 2020-09-18 ENCOUNTER — Inpatient Hospital Stay (HOSPITAL_COMMUNITY)
Admission: AD | Admit: 2020-09-18 | Discharge: 2020-09-18 | Disposition: A | Discharge status: Home / Self Care | Payer: Medicaid Other | Attending: Obstetrics and Gynecology | Admitting: Obstetrics and Gynecology

## 2020-09-18 DIAGNOSIS — O4691 Antepartum hemorrhage, unspecified, first trimester: Secondary | ICD-10-CM

## 2020-09-18 DIAGNOSIS — O26891 Other specified pregnancy related conditions, first trimester: Secondary | ICD-10-CM | POA: Diagnosis present

## 2020-09-18 DIAGNOSIS — R1030 Lower abdominal pain, unspecified: Secondary | ICD-10-CM | POA: Insufficient documentation

## 2020-09-18 DIAGNOSIS — N888 Other specified noninflammatory disorders of cervix uteri: Secondary | ICD-10-CM

## 2020-09-18 DIAGNOSIS — Z3A11 11 weeks gestation of pregnancy: Secondary | ICD-10-CM | POA: Insufficient documentation

## 2020-09-18 DIAGNOSIS — O209 Hemorrhage in early pregnancy, unspecified: Secondary | ICD-10-CM | POA: Diagnosis not present

## 2020-09-18 DIAGNOSIS — O99891 Other specified diseases and conditions complicating pregnancy: Secondary | ICD-10-CM

## 2020-09-18 LAB — URINALYSIS, ROUTINE W REFLEX MICROSCOPIC
Bilirubin Urine: NEGATIVE
Glucose, UA: NEGATIVE mg/dL
Ketones, ur: NEGATIVE mg/dL
Nitrite: NEGATIVE
Protein, ur: 30 mg/dL — AB
Specific Gravity, Urine: 1.021 (ref 1.005–1.030)
WBC, UA: >50 WBC/hpf — ABNORMAL HIGH (ref 0–5)
pH: 7 (ref 5.0–8.0)

## 2020-09-18 MED ORDER — ONDANSETRON 4 MG PO TBDP: 4.0000 mg | ORAL_TABLET | Freq: Three times a day (TID) | ORAL | 0 refills | Status: DC | PRN

## 2020-09-18 MED ORDER — FAMOTIDINE 20 MG PO TABS: 20.0000 mg | ORAL_TABLET | Freq: Two times a day (BID) | ORAL | 1 refills | Status: DC

## 2020-09-18 MED ORDER — ONDANSETRON 4 MG PO TBDP
8.0000 mg | ORAL_TABLET | Freq: Once | ORAL | Status: AC
  Administered 2020-09-18: 8 mg via ORAL
  Filled 2020-09-18: qty 2

## 2020-09-18 MED ORDER — PROMETHAZINE HCL 25 MG PO TABS: 25.0000 mg | ORAL_TABLET | Freq: Four times a day (QID) | ORAL | 1 refills | Status: DC | PRN

## 2020-09-18 NOTE — MAU Provider Note (Signed)
History     CSN: [id number]  Arrival date and time: 09/18/20 2595   Event Date/Time   First Provider Initiated Contact with Patient 09/18/20 [phone]      Chief Complaint  Patient presents with  . Abdominal Pain  . Vaginal Bleeding   HPI Krystal Dixon is a 35 y.o. G3P1011 at [redacted]w[redacted]d who presents with lower abdominal cramping and vaginal bleeding. She states she woke up at midnight with intermittent cramping. She also saw some spotting when she wiped. She was seen on 1/23 with similar complaints and was diagnosed with a normal IUP and presumed UTI. She reports she finished the medication and felt improvement. She denies any bleeding now or abnormal discharge.   OB History    Gravida  3   Para  1   Term  1   Preterm  0   AB  1   Living  1     SAB  1   IAB  0   Ectopic  0   Multiple  0   Live Births  1           Past Medical History:  Diagnosis Date  . Medical history non-contributory     Past Surgical History:  Procedure Laterality Date  . CESAREAN SECTION N/A 04/14/2016   Procedure: CESAREAN SECTION;  Surgeon: Lesly Dukes, MD;  Location: [facility] BIRTHING SUITES;  Service: Obstetrics;  Laterality: N/A;  . NO PAST SURGERIES    . WISDOM TOOTH EXTRACTION      Family History  Problem Relation Age of Onset  . Diabetes Paternal Grandfather   . Diabetes Paternal Uncle     Social History   Tobacco Use  . Smoking status: Never Smoker  . Smokeless tobacco: Never Used  Vaping Use  . Vaping Use: Never used  Substance Use Topics  . Alcohol use: No    Alcohol/week: 0.0 standard drinks  . Drug use: No    Allergies: No Known Allergies  Medications Prior to Admission  Medication Sig Dispense Refill Last Dose  . doxylamine, Sleep, (UNISOM) 25 MG tablet Take 25 mg by mouth at bedtime as needed.   09/17/2020 at 2130  . pyridOXINE (VITAMIN B-6) 100 MG tablet Take 100 mg by mouth daily.   09/17/2020 at 2130    Review of Systems  Constitutional: Negative.   Negative for fatigue and fever.  HENT: Negative.   Respiratory: Negative.  Negative for shortness of breath.   Cardiovascular: Negative.  Negative for chest pain.  Gastrointestinal: Positive for abdominal pain. Negative for constipation, diarrhea, nausea and vomiting.  Genitourinary: Positive for vaginal bleeding. Negative for dysuria.  Neurological: Negative.  Negative for dizziness and headaches.   Physical Exam   Blood pressure 103/71, pulse 82, temperature 98.5 F (36.9 C), temperature source Oral, resp. rate 16, weight 73.2 kg, last menstrual period 07/01/2020, SpO2 98 %.  Physical Exam Vitals and nursing note reviewed.  Constitutional:      General: She is not in acute distress.    Appearance: She is well-developed and well-nourished.  HENT:     Head: Normocephalic.  Eyes:     Pupils: Pupils are equal, round, and reactive to light.  Cardiovascular:     Rate and Rhythm: Normal rate and regular rhythm.     Heart sounds: Normal heart sounds.  Pulmonary:     Effort: Pulmonary effort is normal. No respiratory distress.     Breath sounds: Normal breath sounds.  Abdominal:     General:  Bowel sounds are normal. There is no distension.     Palpations: Abdomen is soft.     Tenderness: There is no abdominal tenderness.  Genitourinary:    Comments: SSE: no blood in vault, small amount of creamy white discharge. Cervical friability noted Skin:    General: Skin is warm and dry.  Neurological:     Mental Status: She is alert and oriented to person, place, and time.  Psychiatric:        Mood and Affect: Mood and affect normal.        Behavior: Behavior normal.        Thought Content: Thought content normal.        Judgment: Judgment normal.     MAU Course  Procedures Results for orders placed or performed during the hospital encounter of 09/18/20 (from the past 24 hour(s))  Urinalysis, Routine w reflex microscopic Urine, Clean Catch     Status: Abnormal   Collection Time:  09/18/20  8:48 AM  Result Value Ref Range   Color, Urine AMBER (A) YELLOW   APPearance CLOUDY (A) CLEAR   Specific Gravity, Urine 1.021 1.005 - 1.030   pH 7.0 5.0 - 8.0   Glucose, UA NEGATIVE NEGATIVE mg/dL   Hgb urine dipstick MODERATE (A) NEGATIVE   Bilirubin Urine NEGATIVE NEGATIVE   Ketones, ur NEGATIVE NEGATIVE mg/dL   Protein, ur 30 (A) NEGATIVE mg/dL   Nitrite NEGATIVE NEGATIVE   Leukocytes,Ua LARGE (A) NEGATIVE   RBC / HPF 11-20 0 - 5 RBC/hpf   WBC, UA >50 (H) 0 - 5 WBC/hpf   Bacteria, UA RARE (A) NONE SEEN   Squamous Epithelial / LPF 0-5 0 - 5   Mucus PRESENT    MDM UA, UC Zofran PO- patient reports resolution of nausea O Pos Patient declines repeat vaginal testing.Will repeat urine culture  Assessment and Plan   1. Friable cervix   2. [redacted] weeks gestation of pregnancy   3. Vaginal bleeding affecting early pregnancy    -Discharge home in stable condition -Rx for zofran, phenergan and pepcid  -First trimester precautions discussed -Patient advised to follow-up with OB as scheduled for prenatal care -Patient may return to MAU as needed or if her condition were to change or worsen   Rolm Bookbinder CNM 09/18/2020, 9:33 AM

## 2020-09-18 NOTE — MAU Note (Signed)
Pt presents to MAU with c/o abdominal pain that started around around 1200 and scant vaginal bleeding present this morning upon waking.

## 2020-09-18 NOTE — Discharge Instructions (Signed)

## 2020-09-19 LAB — CULTURE, OB URINE

## 2020-09-30 DIAGNOSIS — Z3009 Encounter for other general counseling and advice on contraception: Secondary | ICD-10-CM | POA: Diagnosis not present

## 2020-09-30 DIAGNOSIS — Z0389 Encounter for observation for other suspected diseases and conditions ruled out: Secondary | ICD-10-CM | POA: Diagnosis not present

## 2020-09-30 DIAGNOSIS — Z3481 Encounter for supervision of other normal pregnancy, first trimester: Secondary | ICD-10-CM | POA: Diagnosis not present

## 2020-09-30 DIAGNOSIS — Z1388 Encounter for screening for disorder due to exposure to contaminants: Secondary | ICD-10-CM | POA: Diagnosis not present

## 2020-09-30 DIAGNOSIS — O34211 Maternal care for low transverse scar from previous cesarean delivery: Secondary | ICD-10-CM | POA: Diagnosis not present

## 2020-09-30 DIAGNOSIS — Z8759 Personal history of other complications of pregnancy, childbirth and the puerperium: Secondary | ICD-10-CM | POA: Diagnosis not present

## 2020-10-01 ENCOUNTER — Other Ambulatory Visit: Payer: Self-pay | Admitting: Nurse Practitioner

## 2020-10-01 DIAGNOSIS — Z3A13 13 weeks gestation of pregnancy: Secondary | ICD-10-CM

## 2020-10-01 DIAGNOSIS — Z3682 Encounter for antenatal screening for nuchal translucency: Secondary | ICD-10-CM

## 2020-10-21 ENCOUNTER — Ambulatory Visit: Payer: Self-pay | Attending: Nurse Practitioner

## 2020-10-21 ENCOUNTER — Other Ambulatory Visit: Payer: Self-pay

## 2020-10-21 ENCOUNTER — Ambulatory Visit: Payer: Self-pay

## 2020-10-21 ENCOUNTER — Encounter: Payer: Self-pay | Admitting: *Deleted

## 2020-10-21 ENCOUNTER — Ambulatory Visit: Payer: Self-pay | Admitting: *Deleted

## 2020-10-21 VITALS — BP 110/67 | HR 81

## 2020-10-21 DIAGNOSIS — Z3682 Encounter for antenatal screening for nuchal translucency: Secondary | ICD-10-CM | POA: Insufficient documentation

## 2020-10-21 DIAGNOSIS — Z98891 History of uterine scar from previous surgery: Secondary | ICD-10-CM | POA: Insufficient documentation

## 2020-10-21 DIAGNOSIS — Z369 Encounter for antenatal screening, unspecified: Secondary | ICD-10-CM | POA: Insufficient documentation

## 2020-10-21 DIAGNOSIS — Z3A13 13 weeks gestation of pregnancy: Secondary | ICD-10-CM | POA: Insufficient documentation

## 2020-10-25 ENCOUNTER — Other Ambulatory Visit: Payer: Self-pay

## 2020-10-25 LAB — MATERNIT 21 PLUS CORE, BLOOD
Fetal Fraction: 12
Result (T21): NEGATIVE
Trisomy 13 (Patau syndrome): NEGATIVE
Trisomy 18 (Edwards syndrome): NEGATIVE
Trisomy 21 (Down syndrome): NEGATIVE

## 2020-10-26 ENCOUNTER — Telehealth: Payer: Self-pay | Admitting: Obstetrics and Gynecology

## 2020-10-26 NOTE — Telephone Encounter (Signed)
The patient was informed of the results of her recent MaterniT21 testing which yielded NEGATIVE results.  A Spanish interpreter was utilized for this conversation.  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  The sensitivity for trisomy 24, trisomy 39 and trisomy 28 using this testing are reported as 99.1%, 99.9% and 91.7% respectively.  Thus, while the results of this testing are highly accurate, they are not considered diagnostic at this time.  Should more definitive information be desired, the patient may still consider amniocentesis.   As requested to know by the patient, sex chromosome analysis was included for this sample.  Results are consistent with a female fetus. This is predicted with >99% accuracy.   A maternal serum AFP only should be considered if screening for neural tube defects is desired.  We may be reached at [phone] with any questions or concerns.   Cherly Anderson, MS, CGC

## 2020-10-28 DIAGNOSIS — O34211 Maternal care for low transverse scar from previous cesarean delivery: Secondary | ICD-10-CM | POA: Diagnosis not present

## 2020-10-28 DIAGNOSIS — Z3482 Encounter for supervision of other normal pregnancy, second trimester: Secondary | ICD-10-CM | POA: Diagnosis not present

## 2020-10-28 DIAGNOSIS — O219 Vomiting of pregnancy, unspecified: Secondary | ICD-10-CM | POA: Diagnosis not present

## 2020-10-28 DIAGNOSIS — Z8759 Personal history of other complications of pregnancy, childbirth and the puerperium: Secondary | ICD-10-CM | POA: Diagnosis not present

## 2020-10-28 DIAGNOSIS — R823 Hemoglobinuria: Secondary | ICD-10-CM | POA: Diagnosis not present

## 2020-12-06 DIAGNOSIS — Z363 Encounter for antenatal screening for malformations: Secondary | ICD-10-CM | POA: Diagnosis not present

## 2021-01-06 DIAGNOSIS — O283 Abnormal ultrasonic finding on antenatal screening of mother: Secondary | ICD-10-CM | POA: Diagnosis not present

## 2021-02-03 DIAGNOSIS — Z3483 Encounter for supervision of other normal pregnancy, third trimester: Secondary | ICD-10-CM | POA: Diagnosis not present

## 2021-02-04 ENCOUNTER — Other Ambulatory Visit: Payer: Self-pay | Admitting: Family

## 2021-02-04 DIAGNOSIS — Z363 Encounter for antenatal screening for malformations: Secondary | ICD-10-CM

## 2021-02-09 ENCOUNTER — Other Ambulatory Visit: Payer: Self-pay

## 2021-02-17 DIAGNOSIS — Z3483 Encounter for supervision of other normal pregnancy, third trimester: Secondary | ICD-10-CM | POA: Diagnosis not present

## 2021-02-17 DIAGNOSIS — Z23 Encounter for immunization: Secondary | ICD-10-CM | POA: Diagnosis not present

## 2021-02-18 DIAGNOSIS — Z3483 Encounter for supervision of other normal pregnancy, third trimester: Secondary | ICD-10-CM | POA: Diagnosis not present

## 2021-02-21 ENCOUNTER — Other Ambulatory Visit: Payer: Self-pay

## 2021-02-25 ENCOUNTER — Ambulatory Visit: Payer: Medicaid Other

## 2021-02-25 ENCOUNTER — Ambulatory Visit: Payer: Medicaid Other | Attending: Family

## 2021-03-02 ENCOUNTER — Encounter: Payer: Self-pay | Admitting: *Deleted

## 2021-03-03 ENCOUNTER — Ambulatory Visit: Payer: Self-pay

## 2021-03-09 ENCOUNTER — Other Ambulatory Visit: Payer: Self-pay

## 2021-03-09 ENCOUNTER — Ambulatory Visit: Payer: Medicaid Other | Attending: Family

## 2021-03-09 ENCOUNTER — Encounter: Payer: Self-pay | Admitting: *Deleted

## 2021-03-09 ENCOUNTER — Ambulatory Visit: Payer: Medicaid Other | Admitting: *Deleted

## 2021-03-09 VITALS — BP 106/69 | HR 83

## 2021-03-09 DIAGNOSIS — Z363 Encounter for antenatal screening for malformations: Secondary | ICD-10-CM | POA: Diagnosis not present

## 2021-03-09 DIAGNOSIS — O34219 Maternal care for unspecified type scar from previous cesarean delivery: Secondary | ICD-10-CM | POA: Insufficient documentation

## 2021-03-11 ENCOUNTER — Ambulatory Visit: Payer: Medicaid Other

## 2021-03-31 DIAGNOSIS — Z8759 Personal history of other complications of pregnancy, childbirth and the puerperium: Secondary | ICD-10-CM | POA: Diagnosis not present

## 2021-03-31 DIAGNOSIS — O219 Vomiting of pregnancy, unspecified: Secondary | ICD-10-CM | POA: Diagnosis not present

## 2021-03-31 DIAGNOSIS — O34211 Maternal care for low transverse scar from previous cesarean delivery: Secondary | ICD-10-CM | POA: Diagnosis not present

## 2021-03-31 DIAGNOSIS — Z3483 Encounter for supervision of other normal pregnancy, third trimester: Secondary | ICD-10-CM | POA: Diagnosis not present

## 2021-03-31 DIAGNOSIS — O2613 Low weight gain in pregnancy, third trimester: Secondary | ICD-10-CM | POA: Diagnosis not present

## 2021-03-31 DIAGNOSIS — R823 Hemoglobinuria: Secondary | ICD-10-CM | POA: Diagnosis not present

## 2021-03-31 DIAGNOSIS — Z23 Encounter for immunization: Secondary | ICD-10-CM | POA: Diagnosis not present

## 2021-04-03 ENCOUNTER — Encounter (HOSPITAL_COMMUNITY): Payer: Self-pay | Admitting: Obstetrics and Gynecology

## 2021-04-03 ENCOUNTER — Inpatient Hospital Stay (HOSPITAL_COMMUNITY)
Admission: AD | Admit: 2021-04-03 | Discharge: 2021-04-03 | Disposition: A | Discharge status: Home / Self Care | Payer: Medicaid Other | Attending: Obstetrics and Gynecology | Admitting: Obstetrics and Gynecology

## 2021-04-03 ENCOUNTER — Other Ambulatory Visit: Payer: Self-pay

## 2021-04-03 DIAGNOSIS — Z3A37 37 weeks gestation of pregnancy: Secondary | ICD-10-CM

## 2021-04-03 DIAGNOSIS — Z3689 Encounter for other specified antenatal screening: Secondary | ICD-10-CM

## 2021-04-03 DIAGNOSIS — O99713 Diseases of the skin and subcutaneous tissue complicating pregnancy, third trimester: Secondary | ICD-10-CM

## 2021-04-03 DIAGNOSIS — O26893 Other specified pregnancy related conditions, third trimester: Secondary | ICD-10-CM | POA: Insufficient documentation

## 2021-04-03 DIAGNOSIS — Z7982 Long term (current) use of aspirin: Secondary | ICD-10-CM | POA: Insufficient documentation

## 2021-04-03 DIAGNOSIS — L299 Pruritus, unspecified: Secondary | ICD-10-CM | POA: Diagnosis not present

## 2021-04-03 LAB — CBC WITH DIFFERENTIAL/PLATELET
Abs Immature Granulocytes: 0.06 10*3/uL (ref 0.00–0.07)
Basophils Absolute: 0 10*3/uL (ref 0.0–0.1)
Basophils Relative: 0 %
Eosinophils Absolute: 0.1 10*3/uL (ref 0.0–0.5)
Eosinophils Relative: 1 %
HCT: 34.1 % — ABNORMAL LOW (ref 36.0–46.0)
Hemoglobin: 11.6 g/dL — ABNORMAL LOW (ref 12.0–15.0)
Immature Granulocytes: 1 %
Lymphocytes Relative: 16 %
Lymphs Abs: 1.5 10*3/uL (ref 0.7–4.0)
MCH: 27.2 pg (ref 26.0–34.0)
MCHC: 34 g/dL (ref 30.0–36.0)
MCV: 80 fL (ref 80.0–100.0)
Monocytes Absolute: 0.8 10*3/uL (ref 0.1–1.0)
Monocytes Relative: 9 %
Neutro Abs: 6.7 10*3/uL (ref 1.7–7.7)
Neutrophils Relative %: 73 %
Platelets: 221 10*3/uL (ref 150–400)
RBC: 4.26 MIL/uL (ref 3.87–5.11)
RDW: 14 % (ref 11.5–15.5)
WBC: 9.1 10*3/uL (ref 4.0–10.5)
nRBC: 0 % (ref 0.0–0.2)

## 2021-04-03 LAB — URINALYSIS, ROUTINE W REFLEX MICROSCOPIC
Bilirubin Urine: NEGATIVE
Glucose, UA: NEGATIVE mg/dL
Hgb urine dipstick: NEGATIVE
Ketones, ur: NEGATIVE mg/dL
Nitrite: NEGATIVE
Protein, ur: NEGATIVE mg/dL
Specific Gravity, Urine: 1.014 (ref 1.005–1.030)
pH: 6 (ref 5.0–8.0)

## 2021-04-03 LAB — COMPREHENSIVE METABOLIC PANEL
ALT: 40 U/L (ref 0–44)
AST: 38 U/L (ref 15–41)
Albumin: 2.5 g/dL — ABNORMAL LOW (ref 3.5–5.0)
Alkaline Phosphatase: 209 U/L — ABNORMAL HIGH (ref 38–126)
Anion gap: 7 (ref 5–15)
BUN: 11 mg/dL (ref 6–20)
CO2: 21 mmol/L — ABNORMAL LOW (ref 22–32)
Calcium: 8.9 mg/dL (ref 8.9–10.3)
Chloride: 107 mmol/L (ref 98–111)
Creatinine, Ser: 0.57 mg/dL (ref 0.44–1.00)
GFR, Estimated: >60 mL/min (ref >60)
Glucose, Bld: 98 mg/dL (ref 70–99)
Potassium: 3.8 mmol/L (ref 3.5–5.1)
Sodium: 135 mmol/L (ref 135–145)
Total Bilirubin: 0.6 mg/dL (ref 0.3–1.2)
Total Protein: 6.2 g/dL — ABNORMAL LOW (ref 6.5–8.1)

## 2021-04-03 MED ORDER — HYDROXYZINE HCL 25 MG PO TABS
25.0000 mg | ORAL_TABLET | Freq: Once | ORAL | Status: AC
  Administered 2021-04-03: 25 mg via ORAL
  Filled 2021-04-03: qty 1

## 2021-04-03 MED ORDER — URSODIOL 300 MG PO CAPS: 300.0000 mg | ORAL_CAPSULE | Freq: Two times a day (BID) | ORAL | 0 refills | Status: AC

## 2021-04-03 MED ORDER — URSODIOL 300 MG PO CAPS
300.0000 mg | ORAL_CAPSULE | Freq: Once | ORAL | Status: AC
  Administered 2021-04-03: 300 mg via ORAL
  Filled 2021-04-03: qty 1

## 2021-04-03 MED ORDER — DIPHENHYDRAMINE HCL 25 MG PO CAPS: 25.0000 mg | ORAL_CAPSULE | Freq: Once | ORAL | Status: DC

## 2021-04-03 MED ORDER — HYDROXYZINE PAMOATE 25 MG PO CAPS: 25.0000 mg | ORAL_CAPSULE | Freq: Three times a day (TID) | ORAL | 0 refills | Status: AC | PRN

## 2021-04-03 NOTE — MAU Provider Note (Signed)
History     CSN: [id number]  Arrival date and time: 04/03/21 0349   Event Date/Time   First Provider Initiated Contact with Patient 04/03/21 0424      Chief Complaint  Patient presents with   itching   Krystal Dixon is a 35 y.o. G3P1011 at [redacted]w[redacted]d who receives care at [facility].  She presents today for itching.  She states her symptoms started "a few days ago and was only my back and neck."  She states tonight it is all over her body including vagina, belly, hands, and feet.  She endorses fetal movement and denies contractions or abdominal pain.  Patient denies usage of new products or consumption of new foods.  Patient states she has not tried anything for the itching.     OB History     Gravida  3   Para  1   Term  1   Preterm  0   AB  1   Living  1      SAB  1   IAB  0   Ectopic  0   Multiple  0   Live Births  1           Past Medical History:  Diagnosis Date   Medical history non-contributory     Past Surgical History:  Procedure Laterality Date   CESAREAN SECTION N/A 04/14/2016   Procedure: CESAREAN SECTION;  Surgeon: Lesly Dukes, MD;  Location: [facility] BIRTHING SUITES;  Service: Obstetrics;  Laterality: N/A;   WISDOM TOOTH EXTRACTION      Family History  Problem Relation Age of Onset   Diabetes Paternal Grandfather    Diabetes Paternal Uncle     Social History   Tobacco Use   Smoking status: Never   Smokeless tobacco: Never  Vaping Use   Vaping Use: Never used  Substance Use Topics   Alcohol use: No    Alcohol/week: 0.0 standard drinks   Drug use: No    Allergies: No Known Allergies  Medications Prior to Admission  Medication Sig Dispense Refill Last Dose   aspirin EC 81 MG tablet Take 81 mg by mouth daily. Swallow whole.   04/02/2021   Prenatal Vit-Fe Fumarate-FA (PRENATAL MULTIVITAMIN) TABS tablet Take 1 tablet by mouth daily at 12 noon.   04/02/2021   doxylamine, Sleep, (UNISOM) 25 MG tablet Take 25 mg by mouth at bedtime as  needed. (Patient not taking: Reported on 10/21/2020)      famotidine (PEPCID) 20 MG tablet Take 1 tablet (20 mg total) by mouth 2 (two) times daily. (Patient not taking: Reported on 10/21/2020) 30 tablet 1    ondansetron (ZOFRAN ODT) 4 MG disintegrating tablet Take 1 tablet (4 mg total) by mouth every 8 (eight) hours as needed for nausea or vomiting. (Patient not taking: Reported on 10/21/2020) 30 tablet 0    promethazine (PHENERGAN) 25 MG tablet Take 1 tablet (25 mg total) by mouth every 6 (six) hours as needed for nausea or vomiting. (Patient not taking: Reported on 10/21/2020) 30 tablet 1    pyridOXINE (VITAMIN B-6) 100 MG tablet Take 100 mg by mouth daily. (Patient not taking: Reported on 10/21/2020)       Review of Systems  Constitutional:  Negative for appetite change, chills and fever.  Respiratory:  Negative for cough and shortness of breath.   Gastrointestinal:  Negative for abdominal pain, constipation, diarrhea, nausea and vomiting.  Genitourinary:  Negative for difficulty urinating, dysuria, vaginal bleeding and vaginal discharge.  Musculoskeletal:  Negative for back pain.  Neurological:  Negative for dizziness, light-headedness and headaches.  Physical Exam   Blood pressure 117/78, pulse (!) 107, temperature 98.6 F (37 C), temperature source Oral, resp. rate 16, last menstrual period 07/01/2020.  Physical Exam Constitutional:      Appearance: Normal appearance.  HENT:     Head: Normocephalic and atraumatic.  Eyes:     Conjunctiva/sclera: Conjunctivae normal.  Cardiovascular:     Rate and Rhythm: Normal rate.  Pulmonary:     Effort: Pulmonary effort is normal. No respiratory distress.  Abdominal:     Palpations: Abdomen is soft.     Tenderness: There is no abdominal tenderness.     Comments: Gravid Appears AGA  Musculoskeletal:        General: Normal range of motion.     Cervical back: Normal range of motion.  Skin:    General: Skin is warm and dry.     Findings: No  bruising, erythema, lesion or rash.  Neurological:     Mental Status: She is alert.  Psychiatric:        Mood and Affect: Mood normal.        Behavior: Behavior normal.        Thought Content: Thought content normal.    Fetal Assessment 125 bpm, Mod Var, -Decels, +Accels Toco: Q47min   MAU Course   Results for orders placed or performed during the hospital encounter of 04/03/21 (from the past 24 hour(s))  Urinalysis, Routine w reflex microscopic Urine, Clean Catch     Status: Abnormal   Collection Time: 04/03/21  3:58 AM  Result Value Ref Range   Color, Urine YELLOW YELLOW   APPearance HAZY (A) CLEAR   Specific Gravity, Urine 1.014 1.005 - 1.030   pH 6.0 5.0 - 8.0   Glucose, UA NEGATIVE NEGATIVE mg/dL   Hgb urine dipstick NEGATIVE NEGATIVE   Bilirubin Urine NEGATIVE NEGATIVE   Ketones, ur NEGATIVE NEGATIVE mg/dL   Protein, ur NEGATIVE NEGATIVE mg/dL   Nitrite NEGATIVE NEGATIVE   Leukocytes,Ua LARGE (A) NEGATIVE   RBC / HPF 0-5 0 - 5 RBC/hpf   WBC, UA 11-20 0 - 5 WBC/hpf   Bacteria, UA RARE (A) NONE SEEN   Squamous Epithelial / LPF 11-20 0 - 5   Mucus PRESENT    No results found.  MDM PE Labs: CBC, CMP, T.Bile Acids EFM Medications: Vistaril, Ursodiol  Assessment and Plan  35 year old G3P1011  SIUP at 37.1 weeks Cat I FT Pruritus   -Reviewed concern for possible ICP. -Discussed symptoms and treatment. -Cautioned that lab takes ~3-5 days to return. -Informed that if bile acids are elevated the plan would be for IOL. -Patient without questions and concerns. -Exam performed. -Will order vistaril 25mg  and Ursodiol 300mg  now. -NST Reactive -Patient without perception of contractions. Will defer VE. -Plan to draw labs, give meds, and discharge home with precautions.  MSN, CNM 04/03/2021, 4:24 AM   Reassessment (5:52 AM)  -Labs drawn, medication given. -Provider to bedside to discharge.  -Patient instructed to call [facility] on Monday to ask about  pending lab. Also instructed to try to get earlier appt if possible.  -Informed of limited medication sent to pharmacy on file for treatment of symptoms.  -Emphasized importance of fetal kick counts/good fetal movement. -Patient without questions.  Concerns regarding fetal wellbeing addressed. -Patient reports improvement in itching with medication.  -Encouraged to call or return to MAU if symptoms worsen or with the  onset of new symptoms. -Discharged to home in improved condition.  Cherre Robins MSN, CNM Advanced Practice Provider, [facility] [employer]

## 2021-04-03 NOTE — MAU Note (Addendum)
Itching all over body today and urine is dark. No leaking.  No bleeding. No pain.  Baby moving well.

## 2021-04-05 LAB — BILE ACIDS, TOTAL: Bile Acids Total: 7.7 umol/L (ref 0.0–10.0)

## 2021-04-05 NOTE — Patient Instructions (Addendum)
Instrucciones Para Antes de la Ciruga   Su ciruga est programada para 04/16/2021  (your procedure is scheduled on) Entre por la entrada principal del [facility]  a las 0730 de la [place] -(enter through the main entrance at [facility] [employer] AM    [employer], [place] [phone] para informarnos de su llegada. (pick up phone, dial [phone] on arrival)     Por favor llame al [phone] si tiene algn problema en la maana de la ciruga (please call this number if you have any problems the morning of surgery.)                  Recuerde: (Remember)  No coma alimentos. (Do not eat food (After Midnight) Desps de medianoche)    No tome lquidos claros. (Do not drink clear liquids (After Midnight) Desps de medianoche)    No use joyas, maquillaje de ojos, lpiz labial, crema para el cuerpo o esmalte de uas oscuro. (Do not wear jewelry, eye makeup, lipstick, body lotion, or dark fingernail polish). Puede usar desodorante (you may wear deodorant)    No se afeite 48 horas de su ciruga. (Do not shave 48 hours before your surgery)    No traiga objetos de valor al hospital.  [place] no se hace responsable de ninguna pertenencia, ni objetos de valor que haya trado al hospital. (Do not bring valuable to the hospital.  [place] is not responsible for any belongings or valuables brought to the hospital)   [facility] medicinas en la maana de la ciruga con un SORBITO de agua nada (take these meds the morning of surgery with a SIP of water)     Durante la ciruga no se pueden usar lentes de contacto, dentaduras o puentes. (Contacts, dentures or bridgework cannot be worn in surgery).   Si va a ser ingresado despus de la ciruga, deje la [employer] en el carro hasta que se le haya asignado una habitacin. (If you are to be admitted after surgery, leave suitcase in car until your room has been assigned.)   A los pacientes que se  les d de alta el mismo da no se les permitir manejar a casa.  (Patients discharged on the day of surgery will not be allowed to drive home)    [place] [employer]. (Name and telephone number of your driver)   Instrucciones especiales Shower using CHG 2 nights before surgery and the night before surgery.  If you shower the day of surgery use CHG.  Use special wash - you have one bottle of CHG for all showers.  You should use approximately 1/3 of the bottle for each shower. (Special Instructions)   Por favor, lea las hojas informativas que le entregaron. (Please read over the following fact sheets that you were given) Surgical Site Infection Prevention

## 2021-04-06 ENCOUNTER — Encounter (HOSPITAL_COMMUNITY): Payer: Self-pay

## 2021-04-06 NOTE — Pre-Procedure Instructions (Signed)
Interpreter number [phone]

## 2021-04-08 DIAGNOSIS — Z3483 Encounter for supervision of other normal pregnancy, third trimester: Secondary | ICD-10-CM | POA: Diagnosis not present

## 2021-04-10 DIAGNOSIS — Z98891 History of uterine scar from previous surgery: Secondary | ICD-10-CM | POA: Diagnosis present

## 2021-04-10 DIAGNOSIS — O34219 Maternal care for unspecified type scar from previous cesarean delivery: Secondary | ICD-10-CM | POA: Diagnosis present

## 2021-04-10 DIAGNOSIS — Z302 Encounter for sterilization: Secondary | ICD-10-CM

## 2021-04-10 NOTE — H&P (Signed)
Krystal Dixon is an 35 y.o. G3P1011 [redacted]w[redacted]d female.   Chief Complaint: Previous Cesarean delivery HPI: Desires RCS and BTL at 39 weeks  Past Medical History:  Diagnosis Date   Medical history non-contributory     Past Surgical History:  Procedure Laterality Date   CESAREAN SECTION N/A 04/14/2016   Procedure: CESAREAN SECTION;  Surgeon: Krystal Dukes, MD;  Location: [facility] BIRTHING SUITES;  Service: Obstetrics;  Laterality: N/A;   WISDOM TOOTH EXTRACTION      Family History  Problem Relation Age of Onset   Diabetes Paternal Grandfather    Diabetes Paternal Uncle    Social History:  reports that she has never smoked. She has never used smokeless tobacco. She reports that she does not drink alcohol and does not use drugs.   No Known Allergies  No medications prior to admission.     A comprehensive review of systems was negative.  Last menstrual period 07/01/2020. General appearance: alert, cooperative, and appears stated age Head: Normocephalic, without obvious abnormality, atraumatic Neck: supple, symmetrical, trachea midline Lungs:  normal effort Heart: regular rate and rhythm Abdomen:  gravid, non-tender Extremities: extremities normal, atraumatic, no cyanosis or edema Skin: Skin color, texture, turgor normal. No rashes or lesions Neurologic: Grossly normal   Lab Results  Component Value Date   WBC 9.1 04/03/2021   HGB 11.6 (L) 04/03/2021   HCT 34.1 (L) 04/03/2021   MCV 80.0 04/03/2021   PLT 221 04/03/2021           ABO, Rh:    Antibody:    Rubella:    RPR:    HBsAg:    HIV:    GBS:       Prenatal Transfer Tool  Maternal Diabetes: No Genetic Screening: Normal Maternal Ultrasounds/Referrals: Normal Fetal Ultrasounds or other Referrals:  None Maternal Substance Abuse:  No Significant Maternal Medications:  Meds include: Other: Hydroxyzine, ASA Significant Maternal Lab Results: Group B Strep negative   Assessment/Plan Principal Problem:    Previous cesarean section complicating pregnancy, antepartum condition or complication Active Problems:   Encounter for sterilization  For RCS and BTL Risks include but are not limited to bleeding, infection, injury to surrounding structures, including bowel, bladder and ureters, blood clots, and death.  Likelihood of success is high. Patient counseled, r.e. Risks benefits of BTL, including permanency of procedure.  Patient verbalized understanding and desires to proceed   Krystal Dixon 04/10/2021, 7:50 AM

## 2021-04-14 ENCOUNTER — Encounter (HOSPITAL_COMMUNITY)
Admission: AD | Disposition: A | Discharge status: Home / Self Care | Payer: Self-pay | Source: Home / Self Care | Attending: Family Medicine

## 2021-04-14 ENCOUNTER — Inpatient Hospital Stay (HOSPITAL_COMMUNITY)
Admission: AD | Admit: 2021-04-14 | Discharge: 2021-04-17 | DRG: 784 | Disposition: A | Discharge status: Home / Self Care | Payer: Medicaid Other | Attending: Family Medicine | Admitting: Family Medicine

## 2021-04-14 ENCOUNTER — Encounter (HOSPITAL_COMMUNITY): Payer: Self-pay | Admitting: Obstetrics & Gynecology

## 2021-04-14 ENCOUNTER — Inpatient Hospital Stay (HOSPITAL_COMMUNITY): Payer: Medicaid Other | Admitting: Anesthesiology

## 2021-04-14 ENCOUNTER — Other Ambulatory Visit: Payer: Self-pay

## 2021-04-14 ENCOUNTER — Encounter (HOSPITAL_COMMUNITY)
Admission: RE | Admit: 2021-04-14 | Discharge: 2021-04-14 | Disposition: A | Discharge status: Home / Self Care | Payer: Medicaid Other | Source: Ambulatory Visit | Attending: Family Medicine | Admitting: Family Medicine

## 2021-04-14 DIAGNOSIS — Z7982 Long term (current) use of aspirin: Secondary | ICD-10-CM

## 2021-04-14 DIAGNOSIS — Z302 Encounter for sterilization: Secondary | ICD-10-CM | POA: Diagnosis not present

## 2021-04-14 DIAGNOSIS — O26893 Other specified pregnancy related conditions, third trimester: Secondary | ICD-10-CM | POA: Diagnosis not present

## 2021-04-14 DIAGNOSIS — O34219 Maternal care for unspecified type scar from previous cesarean delivery: Secondary | ICD-10-CM | POA: Diagnosis present

## 2021-04-14 DIAGNOSIS — Z3490 Encounter for supervision of normal pregnancy, unspecified, unspecified trimester: Secondary | ICD-10-CM

## 2021-04-14 DIAGNOSIS — O34211 Maternal care for low transverse scar from previous cesarean delivery: Secondary | ICD-10-CM | POA: Diagnosis not present

## 2021-04-14 DIAGNOSIS — Z3A38 38 weeks gestation of pregnancy: Secondary | ICD-10-CM | POA: Diagnosis not present

## 2021-04-14 DIAGNOSIS — Z98891 History of uterine scar from previous surgery: Secondary | ICD-10-CM

## 2021-04-14 DIAGNOSIS — Z20822 Contact with and (suspected) exposure to covid-19: Secondary | ICD-10-CM | POA: Diagnosis not present

## 2021-04-14 LAB — CBC
HCT: 36.4 % (ref 36.0–46.0)
Hemoglobin: 12.1 g/dL (ref 12.0–15.0)
MCH: 26.9 pg (ref 26.0–34.0)
MCHC: 33.2 g/dL (ref 30.0–36.0)
MCV: 80.9 fL (ref 80.0–100.0)
Platelets: 229 10*3/uL (ref 150–400)
RBC: 4.5 MIL/uL (ref 3.87–5.11)
RDW: 14.5 % (ref 11.5–15.5)
WBC: 5.7 10*3/uL (ref 4.0–10.5)
nRBC: 0 % (ref 0.0–0.2)

## 2021-04-14 LAB — RESP PANEL BY RT-PCR (FLU A&B, COVID) ARPGX2
Influenza A by PCR: NEGATIVE
Influenza B by PCR: NEGATIVE
SARS Coronavirus 2 by RT PCR: NEGATIVE

## 2021-04-14 LAB — TYPE AND SCREEN
ABO/RH(D): O POS
Antibody Screen: NEGATIVE

## 2021-04-14 LAB — RPR: RPR Ser Ql: NONREACTIVE

## 2021-04-14 SURGERY — Surgical Case
Anesthesia: Spinal | Laterality: Bilateral

## 2021-04-14 MED ORDER — OXYCODONE HCL 5 MG/5ML PO SOLN
5.0000 mg | Freq: Once | ORAL | Status: DC | PRN
Start: 2021-04-14 — End: 2021-04-14

## 2021-04-14 MED ORDER — NALBUPHINE HCL 10 MG/ML IJ SOLN: 5.0000 mg | Freq: Once | INTRAMUSCULAR | Status: DC | PRN

## 2021-04-14 MED ORDER — KETOROLAC TROMETHAMINE 30 MG/ML IJ SOLN: 30.0000 mg | Freq: Four times a day (QID) | INTRAMUSCULAR | Status: AC | PRN

## 2021-04-14 MED ORDER — METHYLERGONOVINE MALEATE 0.2 MG/ML IJ SOLN
INTRAMUSCULAR | Status: AC
  Filled 2021-04-14: qty 1

## 2021-04-14 MED ORDER — SOD CITRATE-CITRIC ACID 500-334 MG/5ML PO SOLN
30.0000 mL | ORAL | Status: AC
  Administered 2021-04-14: 30 mL via ORAL
  Filled 2021-04-14: qty 30

## 2021-04-14 MED ORDER — OXYTOCIN-SODIUM CHLORIDE 30-0.9 UT/500ML-% IV SOLN: 2.5000 [IU]/h | INTRAVENOUS | Status: AC

## 2021-04-14 MED ORDER — DIPHENHYDRAMINE HCL 25 MG PO CAPS: 25.0000 mg | ORAL_CAPSULE | ORAL | Status: DC | PRN

## 2021-04-14 MED ORDER — MEPERIDINE HCL 25 MG/ML IJ SOLN: 6.2500 mg | INTRAMUSCULAR | Status: DC | PRN

## 2021-04-14 MED ORDER — FENTANYL CITRATE (PF) 100 MCG/2ML IJ SOLN
INTRAMUSCULAR | Status: DC | PRN
  Administered 2021-04-14: 50 ug via INTRAVENOUS
  Administered 2021-04-14 (×2): 25 ug via INTRAVENOUS
  Administered 2021-04-14: 85 ug via INTRAVENOUS

## 2021-04-14 MED ORDER — ONDANSETRON HCL 4 MG/2ML IJ SOLN: 4.0000 mg | Freq: Three times a day (TID) | INTRAMUSCULAR | Status: DC | PRN

## 2021-04-14 MED ORDER — OXYCODONE HCL 5 MG PO TABS
5.0000 mg | ORAL_TABLET | ORAL | Status: DC | PRN
Start: 2021-04-14 — End: 2021-04-17
  Administered 2021-04-16 (×2): 5 mg via ORAL
  Filled 2021-04-14 (×2): qty 1

## 2021-04-14 MED ORDER — LACTATED RINGERS IV SOLN: INTRAVENOUS | Status: DC

## 2021-04-14 MED ORDER — ONDANSETRON HCL 4 MG/2ML IJ SOLN
INTRAMUSCULAR | Status: DC | PRN
  Administered 2021-04-14: 4 mg via INTRAVENOUS

## 2021-04-14 MED ORDER — GABAPENTIN 300 MG PO CAPS
300.0000 mg | ORAL_CAPSULE | ORAL | Status: DC
  Filled 2021-04-14: qty 1

## 2021-04-14 MED ORDER — KETOROLAC TROMETHAMINE 30 MG/ML IJ SOLN
INTRAMUSCULAR | Status: AC
  Filled 2021-04-14: qty 1

## 2021-04-14 MED ORDER — ATROPINE SULFATE 0.4 MG/ML IJ SOLN
INTRAMUSCULAR | Status: DC | PRN
  Administered 2021-04-14: .4 mg via INTRAVENOUS

## 2021-04-14 MED ORDER — PHENYLEPHRINE HCL-NACL 20-0.9 MG/250ML-% IV SOLN
INTRAVENOUS | Status: AC
  Filled 2021-04-14: qty 250

## 2021-04-14 MED ORDER — ACETAMINOPHEN 500 MG PO TABS: 1000.0000 mg | ORAL_TABLET | ORAL | Status: DC

## 2021-04-14 MED ORDER — SIMETHICONE 80 MG PO CHEW: 80.0000 mg | CHEWABLE_TABLET | ORAL | Status: DC | PRN

## 2021-04-14 MED ORDER — HYDROMORPHONE HCL 1 MG/ML IJ SOLN: 1.0000 mg | INTRAMUSCULAR | Status: DC | PRN

## 2021-04-14 MED ORDER — TETANUS-DIPHTH-ACELL PERTUSSIS 5-2.5-18.5 LF-MCG/0.5 IM SUSY: 0.5000 mL | PREFILLED_SYRINGE | Freq: Once | INTRAMUSCULAR | Status: DC

## 2021-04-14 MED ORDER — DIPHENHYDRAMINE HCL 25 MG PO CAPS: 25.0000 mg | ORAL_CAPSULE | Freq: Four times a day (QID) | ORAL | Status: DC | PRN

## 2021-04-14 MED ORDER — KETOROLAC TROMETHAMINE 30 MG/ML IJ SOLN: 30.0000 mg | Freq: Once | INTRAMUSCULAR | Status: DC | PRN

## 2021-04-14 MED ORDER — FENTANYL CITRATE (PF) 100 MCG/2ML IJ SOLN
INTRAMUSCULAR | Status: DC | PRN
  Administered 2021-04-14: 15 ug via INTRATHECAL

## 2021-04-14 MED ORDER — BUPIVACAINE HCL (PF) 0.5 % IJ SOLN
INTRAMUSCULAR | Status: AC
  Filled 2021-04-14: qty 30

## 2021-04-14 MED ORDER — ERYTHROMYCIN 5 MG/GM OP OINT
TOPICAL_OINTMENT | OPHTHALMIC | Status: AC
  Filled 2021-04-14: qty 1

## 2021-04-14 MED ORDER — ACETAMINOPHEN 10 MG/ML IV SOLN
INTRAVENOUS | Status: AC
  Filled 2021-04-14: qty 100

## 2021-04-14 MED ORDER — SENNOSIDES-DOCUSATE SODIUM 8.6-50 MG PO TABS
2.0000 | ORAL_TABLET | Freq: Every day | ORAL | Status: DC
  Administered 2021-04-15 – 2021-04-17 (×3): 2 via ORAL
  Filled 2021-04-14 (×3): qty 2

## 2021-04-14 MED ORDER — MAGNESIUM HYDROXIDE 400 MG/5ML PO SUSP: 30.0000 mL | ORAL | Status: DC | PRN

## 2021-04-14 MED ORDER — SCOPOLAMINE 1 MG/3DAYS TD PT72
MEDICATED_PATCH | TRANSDERMAL | Status: AC
  Filled 2021-04-14: qty 1

## 2021-04-14 MED ORDER — FERROUS SULFATE 325 (65 FE) MG PO TABS
325.0000 mg | ORAL_TABLET | ORAL | Status: DC
  Administered 2021-04-15 – 2021-04-17 (×2): 325 mg via ORAL
  Filled 2021-04-14 (×2): qty 1

## 2021-04-14 MED ORDER — ACETAMINOPHEN 500 MG PO TABS
1000.0000 mg | ORAL_TABLET | Freq: Four times a day (QID) | ORAL | Status: AC
Start: 2021-04-14 — End: 2021-04-15
  Administered 2021-04-14 (×2): 1000 mg via ORAL
  Filled 2021-04-14 (×3): qty 2

## 2021-04-14 MED ORDER — NALBUPHINE HCL 10 MG/ML IJ SOLN: 5.0000 mg | INTRAMUSCULAR | Status: DC | PRN

## 2021-04-14 MED ORDER — DEXAMETHASONE SODIUM PHOSPHATE 4 MG/ML IJ SOLN
INTRAMUSCULAR | Status: AC
  Filled 2021-04-14: qty 1

## 2021-04-14 MED ORDER — DIPHENHYDRAMINE HCL 50 MG/ML IJ SOLN: 12.5000 mg | INTRAMUSCULAR | Status: DC | PRN

## 2021-04-14 MED ORDER — MORPHINE SULFATE (PF) 0.5 MG/ML IJ SOLN
INTRAMUSCULAR | Status: AC
  Filled 2021-04-14: qty 10

## 2021-04-14 MED ORDER — PRENATAL MULTIVITAMIN CH
1.0000 | ORAL_TABLET | Freq: Every day | ORAL | Status: DC
  Administered 2021-04-15 – 2021-04-17 (×3): 1 via ORAL
  Filled 2021-04-14 (×3): qty 1

## 2021-04-14 MED ORDER — OXYCODONE HCL 5 MG PO TABS: 5.0000 mg | ORAL_TABLET | Freq: Once | ORAL | Status: DC | PRN

## 2021-04-14 MED ORDER — OXYTOCIN-SODIUM CHLORIDE 30-0.9 UT/500ML-% IV SOLN
INTRAVENOUS | Status: AC
  Filled 2021-04-14: qty 500

## 2021-04-14 MED ORDER — GABAPENTIN 100 MG PO CAPS
300.0000 mg | ORAL_CAPSULE | Freq: Two times a day (BID) | ORAL | Status: DC
  Administered 2021-04-14 – 2021-04-17 (×7): 300 mg via ORAL
  Filled 2021-04-14 (×7): qty 3

## 2021-04-14 MED ORDER — ENOXAPARIN SODIUM 40 MG/0.4ML IJ SOSY
40.0000 mg | PREFILLED_SYRINGE | INTRAMUSCULAR | Status: DC
  Administered 2021-04-15 – 2021-04-17 (×3): 40 mg via SUBCUTANEOUS
  Filled 2021-04-14 (×3): qty 0.4

## 2021-04-14 MED ORDER — HYDROMORPHONE HCL 1 MG/ML IJ SOLN
0.2500 mg | INTRAMUSCULAR | Status: DC | PRN
  Administered 2021-04-14: 0.25 mg via INTRAVENOUS

## 2021-04-14 MED ORDER — SCOPOLAMINE 1 MG/3DAYS TD PT72
1.0000 | MEDICATED_PATCH | Freq: Once | TRANSDERMAL | Status: AC
  Administered 2021-04-14: 1.5 mg via TRANSDERMAL

## 2021-04-14 MED ORDER — KETOROLAC TROMETHAMINE 30 MG/ML IJ SOLN
30.0000 mg | Freq: Four times a day (QID) | INTRAMUSCULAR | Status: AC
  Administered 2021-04-14 – 2021-04-15 (×2): 30 mg via INTRAVENOUS
  Filled 2021-04-14 (×3): qty 1

## 2021-04-14 MED ORDER — CEFAZOLIN SODIUM-DEXTROSE 2-4 GM/100ML-% IV SOLN
2.0000 g | INTRAVENOUS | Status: AC
  Administered 2021-04-14: 2 g via INTRAVENOUS
  Filled 2021-04-14: qty 100

## 2021-04-14 MED ORDER — BUPIVACAINE IN DEXTROSE 0.75-8.25 % IT SOLN
INTRATHECAL | Status: DC | PRN
  Administered 2021-04-14: 1.6 mL via INTRATHECAL

## 2021-04-14 MED ORDER — ACETAMINOPHEN 10 MG/ML IV SOLN
INTRAVENOUS | Status: DC | PRN
  Administered 2021-04-14: 1000 mg via INTRAVENOUS

## 2021-04-14 MED ORDER — ZOLPIDEM TARTRATE 5 MG PO TABS: 5.0000 mg | ORAL_TABLET | Freq: Every evening | ORAL | Status: DC | PRN

## 2021-04-14 MED ORDER — STERILE WATER FOR IRRIGATION IR SOLN
Status: DC | PRN
  Administered 2021-04-14: 1

## 2021-04-14 MED ORDER — MENTHOL 3 MG MT LOZG: 1.0000 | LOZENGE | OROMUCOSAL | Status: DC | PRN

## 2021-04-14 MED ORDER — MORPHINE SULFATE (PF) 0.5 MG/ML IJ SOLN
INTRAMUSCULAR | Status: DC | PRN
  Administered 2021-04-14: .15 mg via INTRATHECAL

## 2021-04-14 MED ORDER — WITCH HAZEL-GLYCERIN EX PADS: 1.0000 "application " | MEDICATED_PAD | CUTANEOUS | Status: DC | PRN

## 2021-04-14 MED ORDER — ACETAMINOPHEN 500 MG PO TABS: 1000.0000 mg | ORAL_TABLET | Freq: Four times a day (QID) | ORAL | Status: DC

## 2021-04-14 MED ORDER — MEASLES, MUMPS & RUBELLA VAC IJ SOLR: 0.5000 mL | Freq: Once | INTRAMUSCULAR | Status: DC

## 2021-04-14 MED ORDER — HYDROMORPHONE HCL 1 MG/ML IJ SOLN
INTRAMUSCULAR | Status: AC
  Filled 2021-04-14: qty 0.5

## 2021-04-14 MED ORDER — IBUPROFEN 600 MG PO TABS
600.0000 mg | ORAL_TABLET | Freq: Four times a day (QID) | ORAL | Status: DC
  Administered 2021-04-15 – 2021-04-17 (×9): 600 mg via ORAL
  Filled 2021-04-14 (×9): qty 1

## 2021-04-14 MED ORDER — DIBUCAINE (PERIANAL) 1 % EX OINT: 1.0000 "application " | TOPICAL_OINTMENT | CUTANEOUS | Status: DC | PRN

## 2021-04-14 MED ORDER — METHYLERGONOVINE MALEATE 0.2 MG/ML IJ SOLN
INTRAMUSCULAR | Status: DC | PRN
  Administered 2021-04-14: .2 mg via INTRAMUSCULAR

## 2021-04-14 MED ORDER — LACTATED RINGERS IV BOLUS
1000.0000 mL | Freq: Once | INTRAVENOUS | Status: AC
  Administered 2021-04-14: 1000 mL via INTRAVENOUS

## 2021-04-14 MED ORDER — NALOXONE HCL 4 MG/10ML IJ SOLN
1.0000 ug/kg/h | INTRAVENOUS | Status: DC | PRN
  Filled 2021-04-14: qty 5

## 2021-04-14 MED ORDER — OXYTOCIN-SODIUM CHLORIDE 30-0.9 UT/500ML-% IV SOLN
INTRAVENOUS | Status: DC | PRN
Start: 2021-04-14 — End: 2021-04-14
  Administered 2021-04-14: 500 mL via INTRAVENOUS

## 2021-04-14 MED ORDER — FENTANYL CITRATE (PF) 100 MCG/2ML IJ SOLN
INTRAMUSCULAR | Status: AC
  Filled 2021-04-14: qty 2

## 2021-04-14 MED ORDER — NALOXONE HCL 0.4 MG/ML IJ SOLN: 0.4000 mg | INTRAMUSCULAR | Status: DC | PRN

## 2021-04-14 MED ORDER — PROMETHAZINE HCL 25 MG/ML IJ SOLN: 6.2500 mg | INTRAMUSCULAR | Status: DC | PRN

## 2021-04-14 MED ORDER — DEXAMETHASONE SODIUM PHOSPHATE 4 MG/ML IJ SOLN
INTRAMUSCULAR | Status: DC | PRN
  Administered 2021-04-14: 4 mg via INTRAVENOUS

## 2021-04-14 MED ORDER — BUPIVACAINE HCL (PF) 0.5 % IJ SOLN
INTRAMUSCULAR | Status: DC | PRN
  Administered 2021-04-14: 30 mL

## 2021-04-14 MED ORDER — PHENYLEPHRINE HCL-NACL 20-0.9 MG/250ML-% IV SOLN
INTRAVENOUS | Status: DC | PRN
  Administered 2021-04-14: 60 ug/min via INTRAVENOUS

## 2021-04-14 MED ORDER — OXYCODONE-ACETAMINOPHEN 5-325 MG PO TABS
2.0000 | ORAL_TABLET | ORAL | Status: DC | PRN
Start: 2021-04-14 — End: 2021-04-17
  Administered 2021-04-15 (×2): 2 via ORAL
  Filled 2021-04-14 (×2): qty 2

## 2021-04-14 MED ORDER — ONDANSETRON HCL 4 MG/2ML IJ SOLN
INTRAMUSCULAR | Status: AC
  Filled 2021-04-14: qty 2

## 2021-04-14 MED ORDER — COCONUT OIL OIL: 1.0000 "application " | TOPICAL_OIL | Status: DC | PRN

## 2021-04-14 MED ORDER — KETOROLAC TROMETHAMINE 30 MG/ML IJ SOLN
30.0000 mg | Freq: Four times a day (QID) | INTRAMUSCULAR | Status: AC | PRN
  Administered 2021-04-14: 30 mg via INTRAMUSCULAR

## 2021-04-14 MED ORDER — SODIUM CHLORIDE 0.9% FLUSH: 3.0000 mL | INTRAVENOUS | Status: DC | PRN

## 2021-04-14 SURGICAL SUPPLY — 42 items
APL SKNCLS STERI-STRIP NONHPOA (GAUZE/BANDAGES/DRESSINGS) ×1
BENZOIN TINCTURE PRP APPL 2/3 (GAUZE/BANDAGES/DRESSINGS) ×2 IMPLANT
CHLORAPREP W/TINT 26ML (MISCELLANEOUS) ×2 IMPLANT
CLAMP CORD UMBIL (MISCELLANEOUS) IMPLANT
CLIP FILSHIE TUBAL LIGA STRL (Clip) ×2 IMPLANT
CLOTH BEACON ORANGE TIMEOUT ST (SAFETY) ×2 IMPLANT
DRSG OPSITE POSTOP 4X10 (GAUZE/BANDAGES/DRESSINGS) ×2 IMPLANT
ELECT REM PT RETURN 9FT ADLT (ELECTROSURGICAL) ×2
ELECTRODE REM PT RTRN 9FT ADLT (ELECTROSURGICAL) ×1 IMPLANT
EXTRACTOR VACUUM M CUP 4 TUBE (SUCTIONS) IMPLANT
GAUZE SPONGE 4X4 12PLY STRL LF (GAUZE/BANDAGES/DRESSINGS) ×2 IMPLANT
GLOVE BIOGEL PI IND STRL 7.0 (GLOVE) ×3 IMPLANT
GLOVE BIOGEL PI INDICATOR 7.0 (GLOVE) ×3
GLOVE ECLIPSE 7.0 STRL STRAW (GLOVE) ×2 IMPLANT
GOWN STRL REUS W/TWL LRG LVL3 (GOWN DISPOSABLE) ×6 IMPLANT
HEMOSTAT SURGICEL 4X8 (HEMOSTASIS) ×1 IMPLANT
KIT ABG SYR 3ML LUER SLIP (SYRINGE) IMPLANT
NDL HYPO 25X5/8 SAFETYGLIDE (NEEDLE) IMPLANT
NEEDLE HYPO 22GX1.5 SAFETY (NEEDLE) ×2 IMPLANT
NEEDLE HYPO 25X5/8 SAFETYGLIDE (NEEDLE) IMPLANT
NS IRRIG 1000ML POUR BTL (IV SOLUTION) ×2 IMPLANT
PACK C SECTION WH (CUSTOM PROCEDURE TRAY) ×2 IMPLANT
PAD ABD 7.5X8 STRL (GAUZE/BANDAGES/DRESSINGS) ×2 IMPLANT
PAD OB MATERNITY 4.3X12.25 (PERSONAL CARE ITEMS) ×2 IMPLANT
PENCIL SMOKE EVAC W/HOLSTER (ELECTROSURGICAL) ×2 IMPLANT
RETRACTOR TRAXI PANNICULUS (MISCELLANEOUS) IMPLANT
RTRCTR C-SECT PINK 25CM LRG (MISCELLANEOUS) IMPLANT
SPONGE GAUZE 4X4 12PLY STER LF (GAUZE/BANDAGES/DRESSINGS) ×2 IMPLANT
STRIP CLOSURE SKIN 1/2X4 (GAUZE/BANDAGES/DRESSINGS) ×2 IMPLANT
SUT MNCRL 0 VIOLET CTX 36 (SUTURE) ×2 IMPLANT
SUT MONOCRYL 0 CTX 36 (SUTURE) ×4
SUT PLAIN 2 0 (SUTURE) ×2
SUT PLAIN ABS 2-0 CT1 27XMFL (SUTURE) IMPLANT
SUT VIC AB 0 CTX 36 (SUTURE) ×4
SUT VIC AB 0 CTX36XBRD ANBCTRL (SUTURE) ×1 IMPLANT
SUT VIC AB 2-0 CT1 (SUTURE) ×2 IMPLANT
SUT VIC AB 4-0 KS 27 (SUTURE) ×2 IMPLANT
SYR 30ML LL (SYRINGE) ×2 IMPLANT
TOWEL OR 17X24 6PK STRL BLUE (TOWEL DISPOSABLE) ×2 IMPLANT
TRAXI PANNICULUS RETRACTOR (MISCELLANEOUS) ×1
TRAY FOLEY W/BAG SLVR 14FR LF (SET/KITS/TRAYS/PACK) ×2 IMPLANT
WATER STERILE IRR 1000ML POUR (IV SOLUTION) ×2 IMPLANT

## 2021-04-14 NOTE — MAU Note (Signed)
..  Krystal Dixon is a 35 y.o. at [redacted]w[redacted]d here in MAU reporting: pelvic pressure and vaginal bleeding. Patient reports she also has urinary urgency. Reports occasional contractions. +FM.

## 2021-04-14 NOTE — Anesthesia Postprocedure Evaluation (Signed)
Anesthesia Post Note  Patient: Krystal Dixon  Procedure(s) Performed: CESAREAN SECTION WITH POSSIBLE BILATERAL TUBAL LIGATION (Bilateral)     Patient location during evaluation: PACU Anesthesia Type: Spinal Level of consciousness: awake and alert and oriented Pain management: pain level controlled Vital Signs Assessment: post-procedure vital signs reviewed and stable Respiratory status: spontaneous breathing, nonlabored ventilation and respiratory function stable Cardiovascular status: blood pressure returned to baseline and stable Postop Assessment: no headache, no backache, spinal receding, patient able to bend at knees and no apparent nausea or vomiting Anesthetic complications: no   No notable events documented.  Last Vitals:  Vitals:   04/14/21 1202 04/14/21 1213  BP:  103/74  Pulse: (!) 54 (!) 49  Resp: 13 16  Temp: 36.8 C 36.7 C  SpO2: 99%     Last Pain:  Vitals:   04/14/21 1213  TempSrc: Oral  PainSc:    Pain Goal:                Epidural/Spinal Function Cutaneous sensation: No Sensation (04/14/21 1155), Patient able to flex knees: Yes (04/14/21 1155), Patient able to lift hips off bed: Yes (04/14/21 1155), Back pain beyond tenderness at insertion site: No (04/14/21 1139), Progressively worsening motor and/or sensory loss: No (04/14/21 1139)  Lannie Fields

## 2021-04-14 NOTE — Op Note (Signed)
Jaidan Hortencia Pilar PROCEDURE DATE: 04/14/2021  PREOPERATIVE DIAGNOSES: Intrauterine pregnancy at [redacted]w[redacted]d weeks gestation;  early labor; previous cesarean section ; undesired fertility  POSTOPERATIVE DIAGNOSES: The same  PROCEDURE: Low Transverse Cesarean Section, Bilateral Tubal Sterilization using Filshie clips  SURGEON:  Dr. Jaynie Collins  ASSISTANT:  Dr. Warner Mccreedy, Dr. Derrel Nip  ANESTHESIOLOGIY TEAM: Anesthesiologist: Lannie Fields, DO CRNA: Rhymer, Doree Fudge, CRNA   INDICATIONS: Krystal Dixon is a 35 y.o. [phone] at [redacted]w[redacted]d here for cesarean section and bilateral tubal sterilization secondary to the indications listed under preoperative diagnoses; please see preoperative note for further details.  The risks of surgery were discussed with the patient including but were not limited to: bleeding which may require transfusion or reoperation; infection which may require antibiotics; injury to bowel, bladder, ureters or other surrounding organs; injury to the fetus; need for additional procedures including hysterectomy in the event of a life-threatening hemorrhage; formation of adhesions; placental abnormalities wth subsequent pregnancies; incisional problems; thromboembolic phenomenon and other postoperative/anesthesia complications.  Patient also desires permanent sterilization.  Other reversible forms of contraception were discussed with patient; she declines all other modalities.   Risks of sterilization procedure discussed with patient including but not limited to: risk of regret, permanence of method, bleeding, infection, injury to surrounding organs and need for additional procedures.  Failure risk of about 1% with increased risk of ectopic gestation if pregnancy occurs was also discussed with patient.  Also discussed possibility of post-tubal pain and menstrual disorder syndrome. The patient concurred with the proposed plan, giving informed written consent for the  procedures.  FINDINGS:  Viable female infant in cephalic presentation.  Apgars 8 and 8.  Clear amniotic fluid.  Intact placenta, three vessel cord.  Normal uterus, fallopian tubes and ovaries bilaterally. Fallopian tubes were sterilized bilaterally using Filshie clips. Adhesion involving midportion of fundus to lower uterine segment was lysed. Minimal intraperitoneal adhesive disease. Significant superficial venous vessels encountered near hysterotomy, added to increased blood loss during hysterotomy and closure.  ANESTHESIA: Spinal ESTIMATED BLOOD LOSS: 1100 ml SPECIMENS: Placenta sent to L&D COMPLICATIONS: None immediate  PROCEDURE IN DETAIL:  The patient preoperatively received intravenous antibiotics and had sequential compression devices applied to her lower extremities.   She was then taken to the operating room where spinal anesthesia was administered and was found to be adequate. She was then placed in a dorsal supine position with a leftward tilt, and prepped and draped in a sterile manner.  A foley catheter was placed into her bladder and attached to constant gravity.  After an adequate timeout was performed, a Pfannenstiel skin incision was made with scalpel over her preexisting scar and carried through to the underlying layer of fascia. The fascia was incised in the midline, and this incision was extended bilaterally bluntly.  The rectus muscles were separated in the midline and the peritoneum was entered bluntly. The Alexis self-retaining retractor was introduced into the abdominal cavity.  Attention was turned to the lower uterine segment where the aforementioned adhesion was noted and lysed,  then a  low transverse hysterotomy was made with a scalpel and extended bilaterally bluntly.  The infant was successfully delivered, the cord was clamped and cut after one minute, and the infant was handed over to the awaiting neonatology team. Uterine massage was then administered, and the placenta  delivered intact with a three-vessel cord. The uterus was then cleared of clots and debris.  The hysterotomy was closed with 0 Vicryl in a running locked fashion,  and an imbricating layer was also placed with 0 Vicryl.  Several figure-of-eight 0 Vicryl serosal stitches were placed to help with hemostasis.  Attention was then turned to the fallopian tubes, and Filshie clips were placed about 3 cm from the cornua of both fallopian tubes, with care given to incorporate the underlying mesosalpinx on both sides, allowing for bilateral tubal sterilization.  Surgicel was placed over the closed hysterotomy given superficial oozing noted, overall good hemostasis was noted. The pelvis was cleared of all clot and debris. Hemostasis was confirmed on all surfaces.  The retractor was removed.  The peritoneum was closed with a 2-0 Vicryl running stitch. The fascia was then closed using 0 PDS in a running fashion.  The subcutaneous layer was irrigated, reapproximated with 2-0 plain gut interrupted stitches, and the skin was closed with a 4-0 Vicryl subcuticular stitch. The patient tolerated the procedure well. Sponge, instrument and needle counts were correct x 3.  She was taken to the recovery room in stable condition.    Jaynie Collins, MD, FACOG Obstetrician & Gynecologist, [facility] for [employer], [facility]

## 2021-04-14 NOTE — H&P (Addendum)
Obstetric Preoperative History and Physical  Krystal Dixon is a 35 y.o. G3P1011 with IUP at 23w5dpresenting in early labor.  Pt reports contractions starting a few days ago and have become more intense and frequent.  Currently q 2-331m.  + Vaginal bleeding, less than a cup.  No LOF, + fetal movement.  Cesarean Section Indication:  prior C-section for arrest of descent, desires repeat and tubal ligation  [facility]  with onset of care at 13wk weeks Pregnancy complications or risks: -h/o gestational HTN- pt has been taking ASA daily  She plans to breastfeed She desires bilateral tubal ligation for postpartum contraception.   Prenatal labs and studies- reviewed in Media tab ABO, Rh: O positive  Antibody:  negative Rubella:  immune RPR:   negative HBsAg:   neg HIV:   neg GBS: negative GC/C negative 1 hr Glucola  126 Genetic screening normal Anatomy USK[place]ormal  Prenatal Transfer Tool  Maternal Diabetes: No Genetic Screening: Normal Maternal Ultrasounds/Referrals: Normal Fetal Ultrasounds or other Referrals:  None Maternal Substance Abuse:  No Significant Maternal Medications:  None Significant Maternal Lab Results: Group B Strep negative  Past Medical History:  Diagnosis Date   Medical history non-contributory     Past Surgical History:  Procedure Laterality Date   CESAREAN SECTION N/A 04/14/2016   Procedure: CESAREAN SECTION;  Surgeon: KeGuss BundeMD;  [facility]: [facility]st[facility];  Laterality: N/A;   WISDOM TOOTH EXTRACTION      OB History  Gravida Para Term Preterm AB Living  '3 1 1 ' 0 1 1  SAB IAB Ectopic Multiple Live Births  1 0 0 0 1    # Outcome Date GA Lbr Len/2nd Weight Sex Delivery Anes PTL Lv  3 Current           2 Term 04/14/16 397w4d:49 / 02:47 3120 g F CS-LTranv EPI  LIV  1 SAB             Social History   Socioeconomic History   Marital status: Married    Spouse  name: Not on file   Number of children: Not on file   Years of education: Not on file   Highest education level: Not on file  Occupational History   Not on file  Tobacco Use   Smoking status: Never   Smokeless tobacco: Never  Vaping Use   Vaping Use: Never used  Substance and Sexual Activity   Alcohol use: No    Alcohol/week: 0.0 standard drinks   Drug use: No   Sexual activity: Not Currently  Other Topics Concern   Not on file  Social History Narrative   Not on file   Social Determinants of Health   Financial Resource Strain: Not on file  Food Insecurity: Not on file  Transportation Needs: Not on file  Physical Activity: Not on file  Stress: Not on file  Social Connections: Not on file    Family History  Problem Relation Age of Onset   Diabetes Paternal Grandfather    Diabetes Paternal Uncle     Medications Prior to Admission  Medication Sig Dispense Refill Last Dose   aspirin EC 81 MG tablet Take 81 mg by mouth daily at 6 PM. Swallow whole.   04/13/2021   Prenatal Vit-Fe Fumarate-FA (PRENATAL MULTIVITAMIN) TABS tablet Take 1 tablet by mouth daily at 6 PM.   04/13/2021   hydrOXYzine (VISTARIL) 25 MG capsule Take 1 capsule (  25 mg total) by mouth 3 (three) times daily as needed for itching. (Patient not taking: Reported on 04/13/2021) 30 capsule 0    ursodiol (ACTIGALL) 300 MG capsule Take 1 capsule (300 mg total) by mouth 2 (two) times daily. (Patient not taking: Reported on 04/13/2021) 14 capsule 0     No Known Allergies  Review of Systems: Pertinent items noted in HPI and remainder of comprehensive ROS otherwise negative.  Physical Exam: LMP 07/01/2020  CONSTITUTIONAL: Well-developed, well-nourished female in no acute distress.  EYES: Conjunctivae and EOM are normal. Pupils are equal, round, and reactive to light. No scleral icterus.  NECK: Normal range of motion, supple, no masses SKIN: Skin is warm and dry. No rash noted. Not diaphoretic. No erythema. No  pallor. [place]: Alert and oriented to person, place, and time. Normal reflexes, muscle tone coordination. No cranial nerve deficit noted. PSYCHIATRIC: Normal mood and affect. Normal behavior. Normal judgment and thought content. CARDIOVASCULAR: Normal heart rate noted, regular rhythm RESPIRATORY: Effort and breath sounds normal, no problems with respiration noted ABDOMEN: Soft, nontender, nondistended, gravid. Well-healed Pfannenstiel incision. PELVIC: Deferred MUSCULOSKELETAL: Normal range of motion. No edema and no tenderness. 2+ distal pulses.  Pertinent Labs/Studies:   CBC, T&S pending  Assessment and Plan: Krystal Dixon is a 35 y.o. G3P1011 at 75w5dbeing admitted for cesarean section and bilateral tubal ligation due to labor and bilateral tubal ligation. The risks of surgery were discussed with the patient including but were not limited to: bleeding which may require transfusion or reoperation; infection which may require antibiotics; injury to bowel, bladder, ureters or other surrounding organs; injury to the fetus; need for additional procedures including hysterectomy in the event of a life-threatening hemorrhage; formation of adhesions; placental abnormalities wth subsequent pregnancies; incisional problems; thromboembolic phenomenon and other postoperative/anesthesia complications. The patient concurred with the proposed plan, giving informed written consent for the procedure.  Bilateral tubal ligation reviewed with R&B including but not limited to bleeding, infection, injury to other organs, irreversibility and failure rate of 1/500-08/998. The absence of effect on future cycle, PMS and menopause also discussed. Questions answered.    Patient has been NPO since last night she will remain NPO for procedure. Anesthesia and OR aware. Preoperative prophylactic antibiotics and SCDs ordered on call to the OR. To OR when ready.   JOtis Peak DO [place] [facility] [facility] [place] care from Dr. ONelda Marseille Patient met, re-discussed plan for surgery.  Discussed BTL, offered salpingectomy vs Filshie clips, discussed details of both, she desires Filshie clips.  Risks of cesarean section and BTL re-discussed, all questions answered. TO OR when ready.     UVerita Schneiders MD, FEast[place]for [employer] [place]

## 2021-04-14 NOTE — Anesthesia Procedure Notes (Signed)
Spinal  Patient location during procedure: OR Start time: 04/14/2021 9:19 AM End time: 04/14/2021 9:23 AM Reason for block: surgical anesthesia Staffing Performed: anesthesiologist  Anesthesiologist: Lannie Fields, DO Preanesthetic Checklist Completed: patient identified, IV checked, risks and benefits discussed, surgical consent, monitors and equipment checked, pre-op evaluation and timeout performed Spinal Block Patient position: sitting Prep: DuraPrep and site prepped and draped Patient monitoring: cardiac monitor, continuous pulse ox and blood pressure Approach: midline Location: L3-4 Injection technique: single-shot Needle Needle type: Pencan  Needle gauge: 24 G Needle length: 9 cm Assessment Sensory level: T6 Events: CSF return Additional Notes Functioning IV was confirmed and monitors were applied. Sterile prep and drape, including hand hygiene and sterile gloves were used. The patient was positioned and the spine was prepped. The skin was anesthetized with lidocaine.  Free flow of clear CSF was obtained prior to injecting local anesthetic into the CSF.  The spinal needle aspirated freely following injection.  The needle was carefully withdrawn.  The patient tolerated the procedure well.

## 2021-04-14 NOTE — Discharge Summary (Addendum)
Postpartum Discharge Summary     Patient Name: Krystal Dixon DOB: Sep 19, 1985 MRN: [id number]  Date of admission: 04/14/2021 Delivery date:04/14/2021  Delivering provider: Verita Schneiders A  Date of discharge: 04/17/2021  Admitting diagnosis: Intrauterine normal pregnancy [Z34.90] Previous cesarean delivery affecting pregnancy, antepartum [O34.219] Intrauterine pregnancy: [redacted]w[redacted]d    Secondary diagnosis:  Principal Problem:   S/P cesarean section Active Problems:   Encounter for sterilization  Additional problems: None    Discharge diagnosis: Term Pregnancy Delivered                                              Post partum procedures:postpartum tubal ligation Complications: [id number] [facility] of [facility] With Unplanned C/S   35y.o. yo [id number] 329w5dresented in Latent Labor on 04/14/2021. The patient went for cesarean section due to Elective Repeat. Delivery details as follows: Membrane Rupture Time/Date: 9:46 AM ,04/14/2021   Delivery Method:C-Section, Low Transverse  Details of operation can be found in separate operative note. Patient had an uncomplicated postpartum course.  She is ambulating,tolerating a regular diet, passing flatus, and urinating well.  Patient is discharged home in stable condition 04/17/21.  Newborn Data: Birth date:04/14/2021  Birth time:9:46 AM  Gender:Female  Living status:Living  Apgars:8 ,8  Weight:4470 g   Magnesium Sulfate received: No BMZ received: No Rhophylac:N/A MMR:N/A T-DaP: declined per health dept records Flu: N/A Transfusion:No  Physical exam  Vitals:   04/15/21 2058 04/16/21 0411 04/16/21 2205 04/17/21 0510  BP: 98/66 111/68 111/72 116/69  Pulse: 65 65 80 77  Resp: _0 Temp: 98.3 F (36.8 C) 98.5 F (36.9 C) 98.4 F (36.9 C) 98.1 F (36.7 C)  TempSrc: Oral Oral Oral Oral  SpO2: 99% 99% 97% 100%  Weight:      Height:       General: alert, cooperative, and  no distress Lochia: appropriate Uterine Fundus: firm Incision: Healing well with no significant drainage, small areas of previous bleeding marked on dressing yesterday, appear dark red/dry, not extending past marks on dressing DVT Evaluation: No evidence of DVT seen on physical exam. No cords or calf tenderness. No significant calf/ankle edema. Labs: Lab Results  Component Value Date   WBC 8.6 04/17/2021   HGB 9.5 (L) 04/17/2021   HCT 28.6 (L) 04/17/2021   MCV 83.4 04/17/2021   PLT 261 04/17/2021   CMP Latest Ref Rng & Units 04/15/2021  Glucose 70 - 99 mg/dL -  BUN 6 - 20 mg/dL -  Creatinine 0.44 - 1.00 mg/dL 0.71  Sodium 135 - 145 mmol/L -  Potassium 3.5 - 5.1 mmol/L -  Chloride 98 - 111 mmol/L -  CO2 22 - 32 mmol/L -  Calcium 8.9 - 10.3 mg/dL -  Total Protein 6.5 - 8.1 g/dL -  Total Bilirubin 0.3 - 1.2 mg/dL -  Alkaline Phos 38 - 126 U/L -  AST 15 - 41 U/L -  ALT 0 - 44 U/L -   Edinburgh Score: Edinburgh Postnatal Depression Scale Screening Tool 04/17/2021  I have been able to laugh and see the funny side of things. 0  I have looked forward with enjoyment to things. 0  I have blamed myself unnecessarily when things went wrong. 0  I have been anxious or worried for no good reason. 0  I have felt scared or panicky for no good reason. 0  Things have been getting on top of me. 0  I have been so unhappy that I have had difficulty sleeping. 0  I have felt sad or miserable. 0  I have been so unhappy that I have been crying. 0  The thought of harming myself has occurred to me. 0  Edinburgh Postnatal Depression Scale Total 0     After visit meds:  Allergies as of 04/17/2021   No Known Allergies      Medication List     STOP taking these medications    aspirin EC 81 MG tablet       TAKE these medications    acetaminophen 325 MG tablet Commonly known as: Tylenol Take 2 tablets (650 mg total) by mouth every 6 (six) hours as needed.   ferrous sulfate 325 (65 FE)  MG tablet Take 1 tablet (325 mg total) by mouth every other day.   hydrOXYzine 25 MG capsule Commonly known as: Vistaril Take 1 capsule (25 mg total) by mouth 3 (three) times daily as needed for itching.   ibuprofen 600 MG tablet Commonly known as: ADVIL Take 1 tablet (600 mg total) by mouth every 6 (six) hours.   oxyCODONE 5 MG immediate release tablet Commonly known as: Oxy IR/ROXICODONE Take 1-2 tablets (5-10 mg total) by mouth every 4 (four) hours as needed for moderate pain.   prenatal multivitamin Tabs tablet Take 1 tablet by mouth daily at 6 PM.   senna-docusate 8.6-50 MG tablet Commonly known as: Senokot-S Take 2 tablets by mouth daily.   ursodiol 300 MG capsule Commonly known as: Actigall Take 1 capsule (300 mg total) by mouth 2 (two) times daily.       Discharge home in stable condition Infant Feeding: Breast Infant Disposition:home with mother Discharge instruction: per After Visit Summary and Postpartum booklet. Activity: Advance as tolerated. Pelvic rest for 6 weeks.  Diet: routine diet Future Appointments:No future appointments. Follow up Visit:  Follow-up Information     Department, [facility] Follow up in 6 week(s).   Why: Call to schedule postpartum appointment in 6 weeks Contact information: [place] [address] [phone]         [facility] [place] [facility] Follow up in 1 week(s).   Specialty: Obstetrics and Gynecology Why: Incision check in one week, office will call to schedule. Contact information: [address] [place] [address] [phone]               [facility] patient, patient to call and schedule appointment  Please schedule this patient for a In person postpartum visit in 6 weeks with the following provider: Any provider. Additional Postpartum F/U:Incision check 1 week - message sent to [facility] 04/17/2021 Low risk pregnancy complicated by:   None Delivery mode:  C-Section, Low Transverse  Anticipated Birth Control:  BTL done PP (Filshie clip)  04/17/2021 Clarisa Fling, NP

## 2021-04-14 NOTE — Anesthesia Preprocedure Evaluation (Addendum)
Anesthesia Evaluation  Patient identified by MRN, date of birth, ID band Patient awake    Reviewed: Allergy & Precautions, NPO status , Patient's Chart, lab work & pertinent test results  Airway Mallampati: II  TM Distance: >3 FB Neck ROM: Full    Dental no notable dental hx.    Pulmonary neg pulmonary ROS,    Pulmonary exam normal breath sounds clear to auscultation       Cardiovascular hypertension (gest HTN no meds), Normal cardiovascular exam Rhythm:Regular Rate:Normal     Neuro/Psych negative neurological ROS  negative psych ROS   GI/Hepatic negative GI ROS, Neg liver ROS,   Endo/Other  negative endocrine ROS  Renal/GU negative Renal ROS  negative genitourinary   Musculoskeletal negative musculoskeletal ROS (+)   Abdominal   Peds  Hematology hct 36.4, plt 229   Anesthesia Other Findings   Reproductive/Obstetrics (+) Pregnancy 1 prior section Desire sterility                            Anesthesia Physical Anesthesia Plan  ASA: 2  Anesthesia Plan: Spinal   Post-op Pain Management:    Induction:   PONV Risk Score and Plan: 3 and Ondansetron, Dexamethasone and Treatment may vary due to age or medical condition  Airway Management Planned: Natural Airway and Nasal Cannula  Additional Equipment: None  Intra-op Plan:   Post-operative Plan:   Informed Consent: I have reviewed the patients History and Physical, chart, labs and discussed the procedure including the risks, benefits and alternatives for the proposed anesthesia with the patient or authorized representative who has indicated his/her understanding and acceptance.     Dental advisory given  Plan Discussed with: CRNA  Anesthesia Plan Comments:        Anesthesia Quick Evaluation

## 2021-04-14 NOTE — Transfer of Care (Signed)
Immediate Anesthesia Transfer of Care Note  Patient: Krystal Dixon  Procedure(s) Performed: CESAREAN SECTION WITH POSSIBLE BILATERAL TUBAL LIGATION (Bilateral)  Patient Location: PACU  Anesthesia Type:Spinal  Level of Consciousness: awake, alert  and oriented  Airway & Oxygen Therapy: Patient Spontanous Breathing  Post-op Assessment: Report given to RN and Post -op Vital signs reviewed and stable  Post vital signs: Reviewed and stable  Last Vitals:  Vitals Value Taken Time  BP 112/74 04/14/21 1109  Temp    Pulse 65 04/14/21 1112  Resp 13 04/14/21 1112  SpO2 100 % 04/14/21 1112  Vitals shown include unvalidated device data.  Last Pain:  Vitals:   04/14/21 0914  PainSc: 7          Complications: No notable events documented.

## 2021-04-15 DIAGNOSIS — Z3A38 38 weeks gestation of pregnancy: Secondary | ICD-10-CM | POA: Diagnosis not present

## 2021-04-15 DIAGNOSIS — O34211 Maternal care for low transverse scar from previous cesarean delivery: Secondary | ICD-10-CM | POA: Diagnosis not present

## 2021-04-15 LAB — CBC
HCT: 25.6 % — ABNORMAL LOW (ref 36.0–46.0)
Hemoglobin: 8.6 g/dL — ABNORMAL LOW (ref 12.0–15.0)
MCH: 27.6 pg (ref 26.0–34.0)
MCHC: 33.6 g/dL (ref 30.0–36.0)
MCV: 82.1 fL (ref 80.0–100.0)
Platelets: 191 10*3/uL (ref 150–400)
RBC: 3.12 MIL/uL — ABNORMAL LOW (ref 3.87–5.11)
RDW: 14.4 % (ref 11.5–15.5)
WBC: 9.6 10*3/uL (ref 4.0–10.5)
nRBC: 0.2 % (ref 0.0–0.2)

## 2021-04-15 LAB — CREATININE, SERUM
Creatinine, Ser: 0.71 mg/dL (ref 0.44–1.00)
GFR, Estimated: >60 mL/min (ref >60)

## 2021-04-15 MED ORDER — FERROUS SULFATE 325 (65 FE) MG PO TABS: 325.0000 mg | ORAL_TABLET | ORAL | Status: DC

## 2021-04-15 NOTE — Progress Notes (Signed)
POSTPARTUM PROGRESS NOTE  POD #1  Subjective:  Krystal Dixon is a 35 y.o. D3O6712 s/p repeat C-section and bilateral tubal ligation at [redacted]w[redacted]d. Today she notes no acute complaints. Tolerating po intake.  Foley removed this am, plan to void today. Denies nausea or vomiting. She has passed flatus, no BM.  Pain is well controlled.  Lochia minimal Denies fever/chills/chest pain/SOB.  no HA, no blurry vision, noRUQ pain  Objective: Blood pressure (!) 92/57, pulse 61, temperature 98.1 F (36.7 C), temperature source Oral, resp. rate 18, height 5\' 2"  (1.575 m), weight 77 kg, last menstrual period 07/01/2020, SpO2 98 %, unknown if currently breastfeeding.  Physical Exam:  General: alert, cooperative and no distress Chest: no respiratory distress Heart: regular rate and rhythm Abdomen: soft, nontender, +BS Uterine Fundus: firm, appropriately tender Incision: C/D/I DVT Evaluation: No calf swelling or tenderness, SCDs in place Extremities: minimal edema Skin: warm, dry  Results for orders placed or performed during the hospital encounter of 04/14/21 (from the past 24 hour(s))  Creatinine, serum     Status: None   Collection Time: 04/15/21  5:20 AM  Result Value Ref Range   Creatinine, Ser 0.71 0.44 - 1.00 mg/dL   GFR, Estimated 06/15/21 >45 mL/min  CBC     Status: Abnormal   Collection Time: 04/15/21  5:20 AM  Result Value Ref Range   WBC 9.6 4.0 - 10.5 K/uL   RBC 3.12 (L) 3.87 - 5.11 MIL/uL   Hemoglobin 8.6 (L) 12.0 - 15.0 g/dL   HCT 06/15/21 (L) 99.8 - 33.8 %   MCV 82.1 80.0 - 100.0 fL   MCH 27.6 26.0 - 34.0 pg   MCHC 33.6 30.0 - 36.0 g/dL   RDW 25.0 53.9 - 76.7 %   Platelets 191 150 - 400 K/uL   nRBC 0.2 0.0 - 0.2 %    Assessment/Plan: Krystal Dixon is a 35 y.o. 20 s/p repeat C-section and bilateral tubal ligation at [redacted]w[redacted]d POD#1   -pain well controlled -encouraged ambulation today -foley removed this am -plan for oral iron  Contraception: BTL Feeding:  breast  Dispo: Continue with postop care as outlined above   LOS: 1 day   [redacted]w[redacted]d, DO Faculty Attending, Center for [facility] 04/15/2021, 11:40 AM

## 2021-04-16 ENCOUNTER — Inpatient Hospital Stay (HOSPITAL_COMMUNITY): Admission: RE | Admit: 2021-04-16 | Payer: Medicaid Other | Source: Home / Self Care | Admitting: Family Medicine

## 2021-04-16 DIAGNOSIS — Z302 Encounter for sterilization: Secondary | ICD-10-CM

## 2021-04-16 DIAGNOSIS — O34211 Maternal care for low transverse scar from previous cesarean delivery: Secondary | ICD-10-CM | POA: Diagnosis not present

## 2021-04-16 DIAGNOSIS — O34219 Maternal care for unspecified type scar from previous cesarean delivery: Secondary | ICD-10-CM | POA: Diagnosis present

## 2021-04-16 DIAGNOSIS — Z3A38 38 weeks gestation of pregnancy: Secondary | ICD-10-CM | POA: Diagnosis not present

## 2021-04-16 MED ORDER — SODIUM CHLORIDE 0.9 % IV SOLN
500.0000 mg | Freq: Once | INTRAVENOUS | Status: AC
  Administered 2021-04-16: 500 mg via INTRAVENOUS
  Filled 2021-04-16: qty 25

## 2021-04-16 MED ORDER — LACTATED RINGERS IV BOLUS
1000.0000 mL | Freq: Once | INTRAVENOUS | Status: AC
  Administered 2021-04-16: 1000 mL via INTRAVENOUS

## 2021-04-16 NOTE — Progress Notes (Signed)
Post Partum Day 2 Subjective: up ad lib, voiding, tolerating PO, + flatus, and having some dizziness/lightheadedness upon standing/ambulating. Also having some increased pain on the right side of her incision/lower abdomen. Would like to stay another day to make sure her pain is under control and she is not lightheaded upon discharge. Amenable to iron infusion today.  Objective: Blood pressure 111/68, pulse 65, temperature 98.5 F (36.9 C), temperature source Oral, resp. rate 18, height 5\' 2"  (1.575 m), weight 169 lb 12.1 oz (77 kg), last menstrual period 07/01/2020, SpO2 99 %, unknown if currently breastfeeding.  Physical Exam:  General: alert, cooperative, appears stated age, fatigued, and no distress Lochia: appropriate Uterine Fundus: firm Incision: healing well, no significant drainage, no excessive tenderness to incision site DVT Evaluation: No evidence of DVT seen on physical exam.  Recent Labs    04/14/21 0738 04/15/21 0520  HGB 12.1 8.6*  HCT 36.4 25.6*    Assessment/Plan: Plan for discharge tomorrow. LR bolus and IV venofer ordered. Encouraged pt to take stool softener as she has not had a bowel movement yet and walk as tolerated once she's had the IV fluids and venofer. RN notified of new orders.   LOS: 2 days   06/15/21 04/16/2021, 11:01 AM

## 2021-04-17 DIAGNOSIS — O34211 Maternal care for low transverse scar from previous cesarean delivery: Secondary | ICD-10-CM | POA: Diagnosis not present

## 2021-04-17 DIAGNOSIS — Z3A38 38 weeks gestation of pregnancy: Secondary | ICD-10-CM | POA: Diagnosis not present

## 2021-04-17 LAB — CBC WITH DIFFERENTIAL/PLATELET
Abs Immature Granulocytes: 0.22 10*3/uL — ABNORMAL HIGH (ref 0.00–0.07)
Basophils Absolute: 0 10*3/uL (ref 0.0–0.1)
Basophils Relative: 0 %
Eosinophils Absolute: 0.1 10*3/uL (ref 0.0–0.5)
Eosinophils Relative: 1 %
HCT: 28.6 % — ABNORMAL LOW (ref 36.0–46.0)
Hemoglobin: 9.5 g/dL — ABNORMAL LOW (ref 12.0–15.0)
Immature Granulocytes: 3 %
Lymphocytes Relative: 19 %
Lymphs Abs: 1.6 10*3/uL (ref 0.7–4.0)
MCH: 27.7 pg (ref 26.0–34.0)
MCHC: 33.2 g/dL (ref 30.0–36.0)
MCV: 83.4 fL (ref 80.0–100.0)
Monocytes Absolute: 0.4 10*3/uL (ref 0.1–1.0)
Monocytes Relative: 4 %
Neutro Abs: 6.2 10*3/uL (ref 1.7–7.7)
Neutrophils Relative %: 73 %
Platelets: 261 10*3/uL (ref 150–400)
RBC: 3.43 MIL/uL — ABNORMAL LOW (ref 3.87–5.11)
RDW: 15 % (ref 11.5–15.5)
WBC: 8.6 10*3/uL (ref 4.0–10.5)
nRBC: 0 % (ref 0.0–0.2)

## 2021-04-17 MED ORDER — FERROUS SULFATE 325 (65 FE) MG PO TABS: 325.0000 mg | ORAL_TABLET | ORAL | 1 refills | Status: AC

## 2021-04-17 MED ORDER — SENNOSIDES-DOCUSATE SODIUM 8.6-50 MG PO TABS: 2.0000 | ORAL_TABLET | Freq: Every day | ORAL | 1 refills | Status: AC

## 2021-04-17 MED ORDER — ACETAMINOPHEN 325 MG PO TABS: 650.0000 mg | ORAL_TABLET | Freq: Four times a day (QID) | ORAL | 0 refills | Status: AC | PRN

## 2021-04-17 MED ORDER — IBUPROFEN 600 MG PO TABS: 600.0000 mg | ORAL_TABLET | Freq: Four times a day (QID) | ORAL | 0 refills | Status: AC

## 2021-04-17 MED ORDER — OXYCODONE HCL 5 MG PO TABS: 5.0000 mg | ORAL_TABLET | ORAL | 0 refills | Status: AC | PRN

## 2021-04-18 ENCOUNTER — Telehealth: Payer: Self-pay

## 2021-04-18 NOTE — Telephone Encounter (Signed)
Transition Care Management Unsuccessful Follow-up Telephone Call  Date of discharge and from where:  04/17/21 from [facility]  Attempts:  1st Attempt  Reason for unsuccessful TCM follow-up call:  Unable to leave message

## 2021-04-19 NOTE — Telephone Encounter (Signed)
Transition Care Management Follow-up Telephone Call Date of discharge and from where: 04/17/2021 from [facility] How have you been since you were released from the hospital? Pt stated that she is feeling well today and did not have any questions or concerns at this time.  Any questions or concerns? No  Items Reviewed: Did the pt receive and understand the discharge instructions provided? Yes  Medications obtained and verified? Yes  Other? No  Any new allergies since your discharge? No  Dietary orders reviewed? No Do you have support at home? Yes   Functional Questionnaire: (I = Independent and D = Dependent) ADLs: I  Bathing/Dressing- I  Meal Prep- I  Eating- I  Maintaining continence- I  Transferring/Ambulation- I  Managing Meds- I   Follow up appointments reviewed:  PCP Hospital f/u appt confirmed? No   Specialist Hospital f/u appt confirmed? No  Pt will call and schedule follow up with OBGYN Are transportation arrangements needed? No  If their condition worsens, is the pt aware to call PCP or go to the Emergency Dept.? Yes Was the patient provided with contact information for the PCP's office or ED? Yes Was to pt encouraged to call back with questions or concerns? Yes

## 2021-04-21 ENCOUNTER — Ambulatory Visit (INDEPENDENT_AMBULATORY_CARE_PROVIDER_SITE_OTHER): Payer: Medicaid Other

## 2021-04-21 ENCOUNTER — Other Ambulatory Visit: Payer: Self-pay

## 2021-04-21 ENCOUNTER — Ambulatory Visit: Payer: Medicaid Other

## 2021-04-21 VITALS — BP 111/78 | HR 84 | Wt 159.2 lb

## 2021-04-21 DIAGNOSIS — Z5189 Encounter for other specified aftercare: Secondary | ICD-10-CM

## 2021-04-21 NOTE — Progress Notes (Signed)
Incision Check  Here today for incision check following repeat c-section on 04/14/21 with BTL. Pt reports minimal pain to incision site; denies any need for pain medicine currently. Reports occasional pain that feels bowel related, reports daily BM. Reviewed stool softeners available otc if needed. Reviewed good wound care and s/s of infection with patient and husband. Incision is fully covered by steri strips. Heavily soiled strips removed and replaced with 6 new steri strips, soiled with red drainage. Incision underneath removed strips is clean and dry, appears to be pink, healing tissue. Pt to call [facility] Department to schedule PP visit 3-5 weeks from today.  Patient denied need for translator today. Preference changed in demographics.  Fleet Contras RN 04/21/21

## 2021-04-22 ENCOUNTER — Ambulatory Visit: Payer: Medicaid Other

## 2021-04-22 NOTE — Progress Notes (Signed)
Patient was assessed and managed by nursing staff during this encounter. I have reviewed the chart and agree with the documentation and plan. I have also made any necessary editorial changes.  Warden Fillers, MD 04/22/2021 12:25 PM

## 2021-04-27 ENCOUNTER — Telehealth (HOSPITAL_COMMUNITY): Payer: Self-pay | Admitting: *Deleted

## 2021-04-27 NOTE — Telephone Encounter (Signed)
Patient voiced no questions or concerns regarding her own health at this time. EPDS=0. Patient stated that the baby "has times when he breathes heavy," and asked if this was normal. No nasal flaring reported. Patient reported that infant's color is good. No signs of distress reported. RN assured patient that episodes of deep breathing are normal. Instructed patient to contact her pediatrician if concerns arise before her scheduled appointment on 9/26. Patient verbalized understanding. Patient reported that infant sleeps on his back in a crib. RN reviewed ABCs of safe sleep. Patient verbalized understanding. Deforest Hoyles, RN, 04/27/21, [phone].

## 2021-10-24 IMAGING — US US MFM FETAL NUCHAL TRANSLUCENCY
1 series · 14 of 28 positions shown · non-contrast
Comparison: none

[Series 1: us mfm fetal nuchal translucency · 73 acquisitions, 14 frames shown]
[im 3/73]
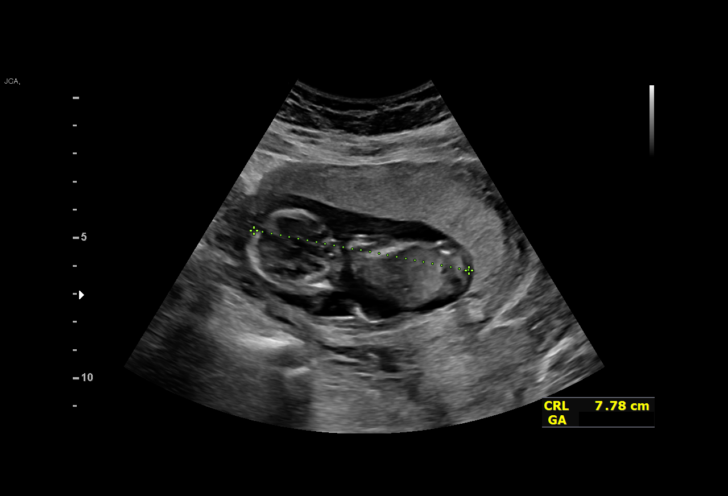
[im 9/73]
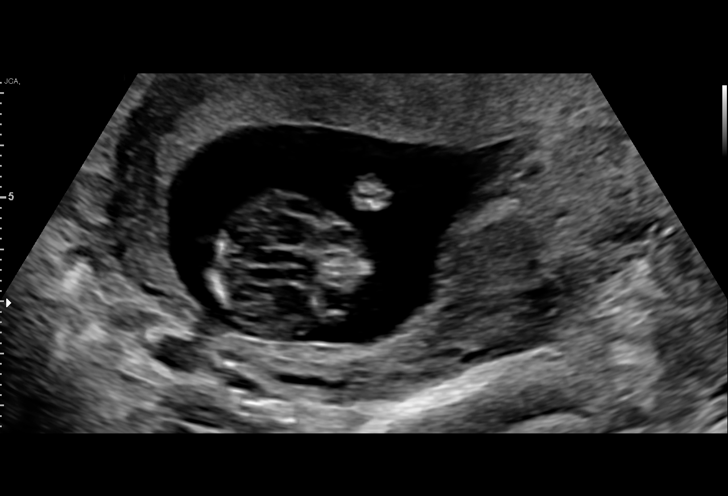
[im 14/73]
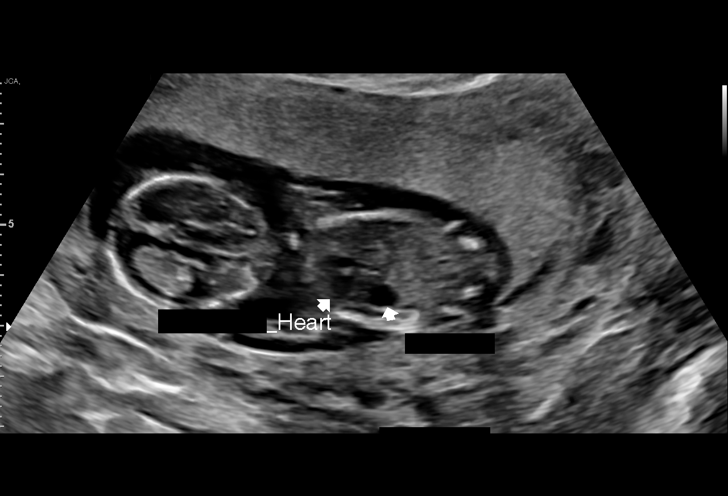
[im 19/73]
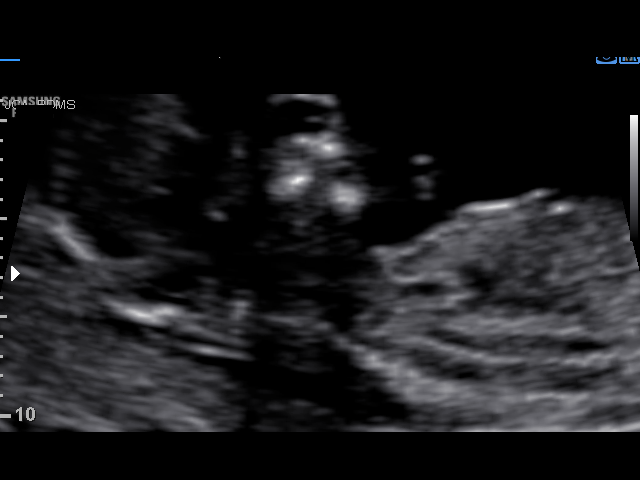
[im 25/73]
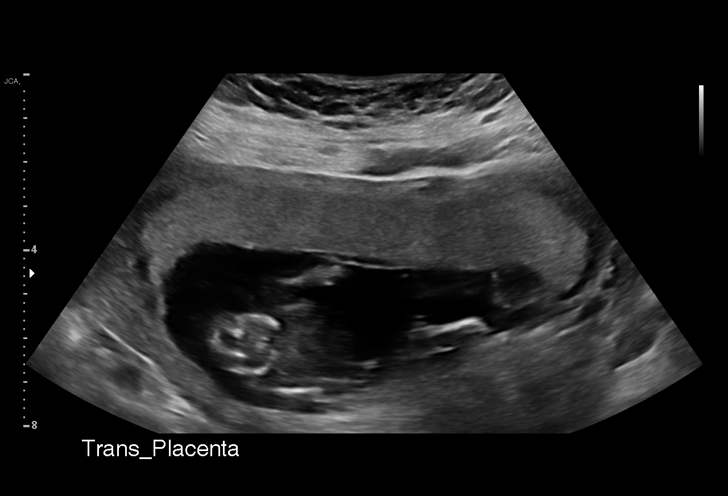
[im 30/73]
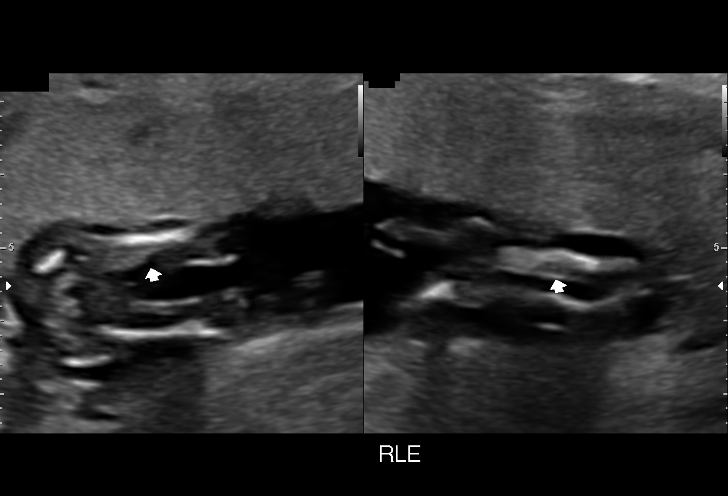
[im 35/73]
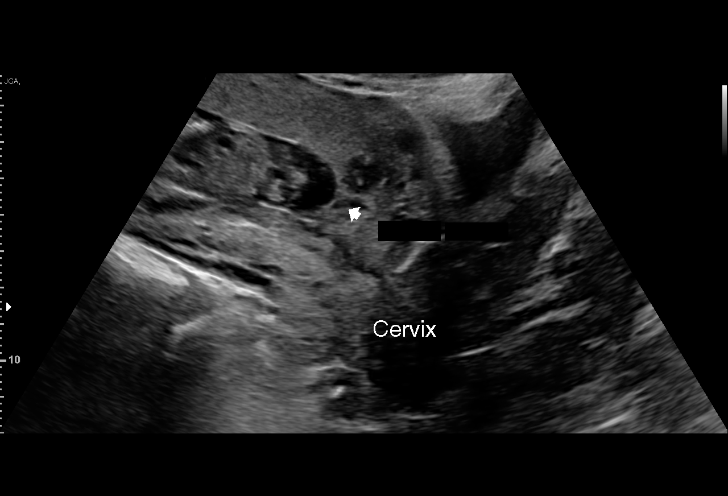
[im 41/73]
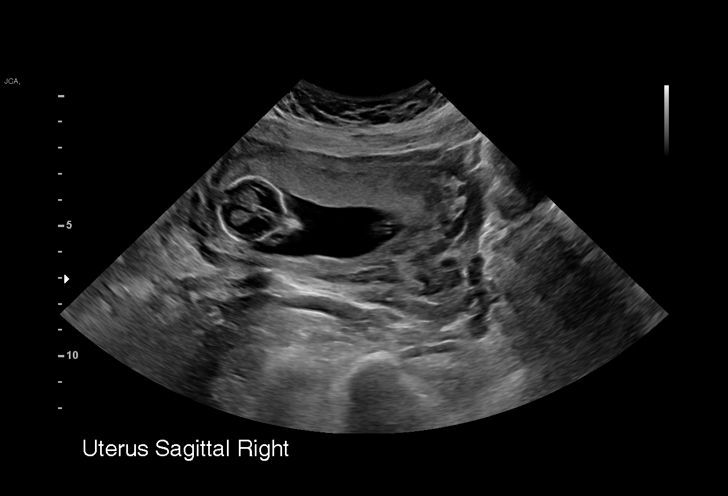
[im 46/73]
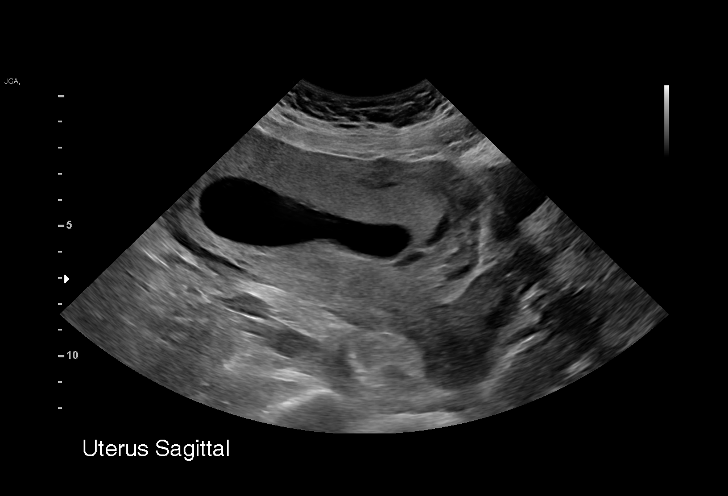
[im 51/73]
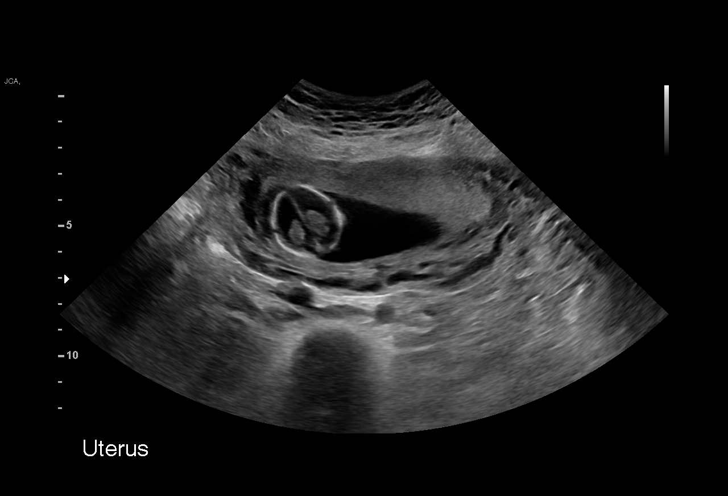
[im 57/73]
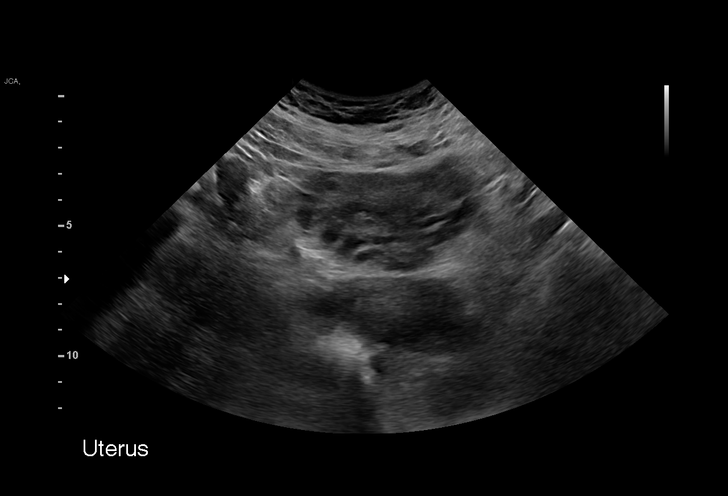
[im 62/73]
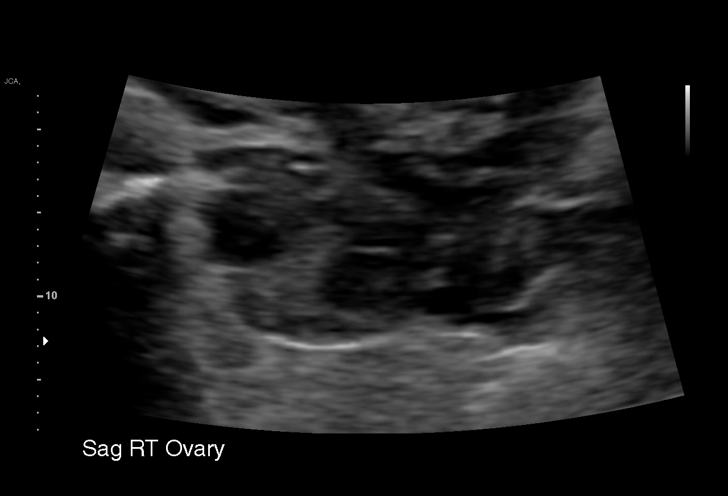
[im 67/73]
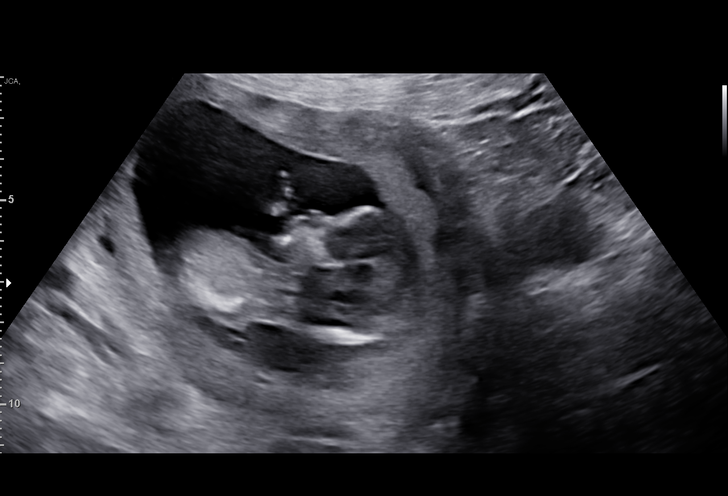
[im 73/73]
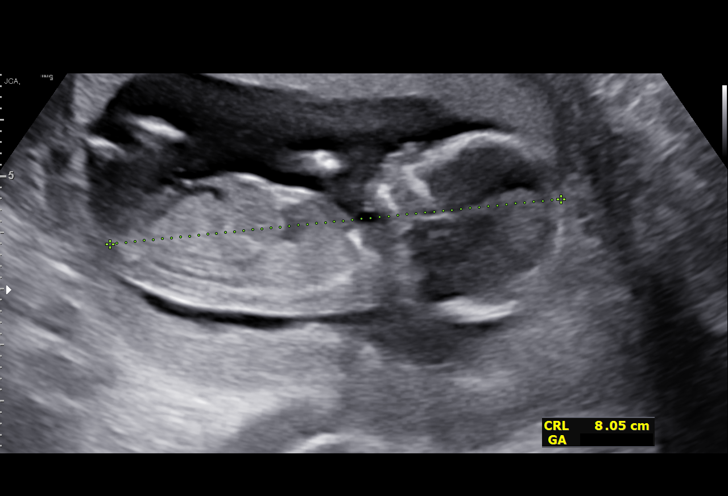

[14 of 28 positions shown; findings below may reference images not displayed]

[REDACTED]-
                   Faculty Physician

    TRANSLUCENCY

Indications

 Poor obstetric history: Previous
 preeclampsia / eclampsia/gestational HTN
 History of cesarean delivery, currently
 pregnant
 Encounter for nuchal translucency
 13 weeks gestation of pregnancy
Fetal Evaluation

 Num Of Fetuses:         1
 Fetal Heart Rate(bpm):  164
 Cardiac Activity:       Observed
 Presentation:           Breech
 Placenta:               Anterior
 P. Cord Insertion:      Visualized

 Amniotic Fluid
 AFI FV:      Within normal limits

                             Largest Pocket(cm)

Biometry
 CRL:      77.9  mm     G. Age:  13w 4d                  EDD:   04/24/21

 NT:        1.2  mm
OB History

 Gravidity:    3         Term:   1         SAB:   1
 Living:       1
Gestational Age

 Best:          13w 5d     Det. By:  Early Ultrasound         EDD:   04/23/21
                                     (08/29/20)
Anatomy

 Choroid Plexus:        Visualized             Abdomen:                Visualized
 Face:                  Visualized             Abdominal Wall:         Visualized
 Thoracic:              Visualized             Bladder:                Visualized
 Heart:                 Visualized             Upper Extremities:      Visualized
 Diaphragm:             Visualized             Lower Extremities:      Visualized
 Stomach:               Visualized
Cervix Uterus Adnexa

 Cervix
 Length:           3.72  cm.
 Normal appearance by transabdominal scan.
Comments

 This patient was seen for a first trimester ultrasound to
 screen for fetal aneuploidy.  She denies any problems in her
 current pregnancy and denies any significant past medical
 history.

 The crown-rump length measured today is consistent with her
 gestational age.

 The nuchal translucency measurement was 1.2 mm, which is
 within normal limits.

 The patient will have blood work drawn following today's
 ultrasound exam for a cell free DNA test (Materni T21).  Our
 genetic counselor will notify the patient regarding the results
 of her cell free DNA test.

 She should have a fetal anatomy scan performed at around
 19 weeks.

## 2024-06-10 ENCOUNTER — Ambulatory Visit (INDEPENDENT_AMBULATORY_CARE_PROVIDER_SITE_OTHER): Payer: Self-pay

## 2024-06-10 VITALS — BP 115/80 | HR 72 | Temp 98.7°F | Resp 16 | Ht 63.0 in | Wt 164.0 lb

## 2024-06-10 DIAGNOSIS — Z7689 Persons encountering health services in other specified circumstances: Secondary | ICD-10-CM

## 2024-06-10 DIAGNOSIS — R102 Pelvic and perineal pain unspecified side: Secondary | ICD-10-CM

## 2024-06-10 NOTE — Progress Notes (Signed)
 Patient ID: Krystal Dixon, female    DOB: 1985/10/04  MRN: [id number]  CC: Establish Care   Subjective: Krystal Dixon is a 38 y.o. female with no pertinent past medical history who presents to clinic to establish care. Pt's main concern is recurring sharp pelvic pain bilaterally.  Patient reports that pain began about 6 months ago and it is now daily.  Pain is rated at 4-5 out of 10.  Patient reports that taking Excedrin for migraines helps with this pain.  Denies painful urination, vaginal discharge, bleeding.  Reports normal bowel habits, denies constipation or diarrhea.    Last CPE:> 3 years ago    Last pap: 3 years ago  LMP: 1 month ago    Patient Active Problem List   Diagnosis Date Noted   S/P cesarean section 04/10/2021   Encounter for sterilization 04/10/2021     Current Outpatient Medications on File Prior to Visit  Medication Sig Dispense Refill   acetaminophen  (TYLENOL ) 325 MG tablet Take 2 tablets (650 mg total) by mouth every 6 (six) hours as needed. (Patient not taking: Reported on 04/21/2021) 30 tablet 0   ferrous sulfate  325 (65 FE) MG tablet Take 1 tablet (325 mg total) by mouth every other day. (Patient not taking: Reported on 04/21/2021) 30 tablet 1   hydrOXYzine  (VISTARIL ) 25 MG capsule Take 1 capsule (25 mg total) by mouth 3 (three) times daily as needed for itching. (Patient not taking: No sig reported) 30 capsule 0   ibuprofen  (ADVIL ) 600 MG tablet Take 1 tablet (600 mg total) by mouth every 6 (six) hours. (Patient not taking: Reported on 04/21/2021) 30 tablet 0   oxyCODONE  (OXY IR/ROXICODONE ) 5 MG immediate release tablet Take 1-2 tablets (5-10 mg total) by mouth every 4 (four) hours as needed for moderate pain. (Patient not taking: Reported on 04/21/2021) 10 tablet 0   Prenatal Vit-Fe Fumarate-FA (PRENATAL MULTIVITAMIN) TABS tablet Take 1 tablet by mouth daily at 6 PM. (Patient not taking: Reported on 04/21/2021)     senna-docusate  (SENOKOT-S) 8.6-50 MG tablet Take 2 tablets by mouth daily. (Patient not taking: Reported on 04/21/2021) 30 tablet 1   ursodiol  (ACTIGALL ) 300 MG capsule Take 1 capsule (300 mg total) by mouth 2 (two) times daily. (Patient not taking: No sig reported) 14 capsule 0   No current facility-administered medications on file prior to visit.    No Known Allergies  Social History   Socioeconomic History   Marital status: Married    Spouse name: Not on file   Number of children: Not on file   Years of education: Not on file   Highest education level: Not on file  Occupational History   Not on file  Tobacco Use   Smoking status: Never   Smokeless tobacco: Never  Vaping Use   Vaping status: Never Used  Substance and Sexual Activity   Alcohol use: No    Alcohol/week: 0.0 standard drinks of alcohol   Drug use: No   Sexual activity: Not Currently  Other Topics Concern   Not on file  Social History Narrative   Not on file   Social Drivers of Health   Financial Resource Strain: Low Risk  (06/10/2024)   Overall Financial Resource Strain (CARDIA)    Difficulty of Paying Living Expenses: Not very hard  Food Insecurity: No Food Insecurity (06/10/2024)   Hunger Vital Sign    Worried About Running Out of Food in the Last Year: Never true  Ran Out of Food in the Last Year: Never true  Transportation Needs: No Transportation Needs (06/10/2024)   PRAPARE - Administrator, Civil Service (Medical): No    Lack of Transportation (Non-Medical): No  Physical Activity: Inactive (06/10/2024)   Exercise Vital Sign    Days of Exercise per Week: 0 days    Minutes of Exercise per Session: 0 min  Stress: No Stress Concern Present (06/10/2024)   [employer] of Occupational Health - Occupational Stress Questionnaire    Feeling of Stress: Not at all  Social Connections: Moderately Integrated (06/10/2024)   Social Connection and Isolation Panel    Frequency of Communication with Friends and  Family: Twice a week    Frequency of Social Gatherings with Friends and Family: Twice a week    Attends Religious Services: 1 to 4 times per year    Active Member of [employer] or Organizations: No    Attends Banker Meetings: Never    Marital Status: Living with partner  Intimate Partner Violence: Not At Risk (06/10/2024)   Humiliation, Afraid, Rape, and Kick questionnaire    Fear of Current or Ex-Partner: No    Emotionally Abused: No    Physically Abused: No    Sexually Abused: No    Family History  Problem Relation Age of Onset   Diabetes Paternal Grandfather    Diabetes Paternal Uncle     Past Surgical History:  Procedure Laterality Date   CESAREAN SECTION N/A 04/14/2016   Procedure: CESAREAN SECTION;  Surgeon: Burnard VEAR Pate, MD;  Location: [facility];  Service: Obstetrics;  Laterality: N/A;   CESAREAN SECTION WITH BILATERAL TUBAL LIGATION Bilateral 04/14/2021   Procedure: CESAREAN SECTION WITH POSSIBLE BILATERAL TUBAL LIGATION;  Surgeon: Herchel Gloris LABOR, MD;  Location: [facility];  Service: Obstetrics;  Laterality: Bilateral;   WISDOM TOOTH EXTRACTION      ROS: Review of Systems Negative except as stated above  PHYSICAL EXAM: BP 115/80   Pulse 72   Temp 98.7 F (37.1 C) (Oral)   Resp 16   Ht 5' 3 (1.6 m)   Wt 164 lb (74.4 kg)   SpO2 97%   BMI 29.05 kg/m   Physical Exam  General: well-appearing, no acute distress Skin: no jaundice, rashes, or lesions Cardiovascular: regular heart rate and rhythm, normal S1/S2, no murmurs, gallops, or rubs, peripheral pulses 2+ bilaterally Chest: no skeletal deformity, lungs clear to auscultation bilaterally, equal breath sounds bilaterally Abdomen: soft, non-distended, no hepatomegaly, no splenomegaly, normoactive bowel sounds.  Pain to deep palpation of right and left suprapubic area.  Musculoskeletal: normal gait Extremities: no peripheral edema   ASSESSMENT AND PLAN:  1. Encounter to establish care with  new provider (Primary)  2. Pelvic pain - Per patient preference returning Friday 11/7 for Pap smear.  - US  OB Transvaginal; Future   Patient was given the opportunity to ask questions.  Patient verbalized understanding of the plan and was able to repeat key elements of the plan.    Orders Placed This Encounter  Procedures   US  OB Transvaginal     Requested Prescriptions    No prescriptions requested or ordered in this encounter    Return in about 3 days (around 06/13/2024) for Pap smear.  Sula Leavy Rode, PA-C

## 2024-06-13 ENCOUNTER — Other Ambulatory Visit (HOSPITAL_COMMUNITY)
Admission: RE | Admit: 2024-06-13 | Discharge: 2024-06-13 | Disposition: A | Discharge status: Home / Self Care | Payer: Self-pay | Source: Ambulatory Visit

## 2024-06-13 ENCOUNTER — Ambulatory Visit: Payer: Self-pay

## 2024-06-13 VITALS — BP 117/76 | HR 75 | Temp 98.1°F | Resp 16 | Wt 164.0 lb

## 2024-06-13 DIAGNOSIS — Z124 Encounter for screening for malignant neoplasm of cervix: Secondary | ICD-10-CM

## 2024-06-13 NOTE — Progress Notes (Signed)
     Patient ID: Krystal Dixon, female    DOB: 07-Jan-1986  MRN: [id number]  CC: Gynecologic Exam   Subjective: Krystal Dixon is a 38 y.o. female who presents for follow-up on recurring sharp pelvic pain bilaterally.  Patient reports that pain began about 6 months ago and it is now daily.  Pain is rated at 4-5 out of 10.  Scheduled for Pap smear today.  No Known Allergies  ROS: Review of Systems Negative except as stated above  PHYSICAL EXAM: BP 117/76   Pulse 75   Temp 98.1 F (36.7 C) (Oral)   Resp 16   Wt 164 lb (74.4 kg)   SpO2 96%   BMI 29.05 kg/m   Physical Exam  General: well-appearing, no acute distress Skin: no jaundice, rashes, or lesions Abdomen: soft, non-distended, non-tender to palpation, no hepatomegaly, no splenomegaly, normoactive bowel sounds GU: Normal appearing vulva, no lesions, inflammation, or discharge observed. Vagina appears normal with no lesions, viscous white discharge noted surrounding cervix and on vaginal canal. Cervix is visualized, appearing pink and smooth, with no visible lesions. Pap smear collected.  Musculoskeletal: normal gait Extremities: no peripheral edema  CMA Gustav present in room during pap smear exam assisting with procedure.   ASSESSMENT AND PLAN:  1. Encounter for Papanicolaou smear for cervical cancer screening (Primary) - Cytology - PAP - Cervicovaginal ancillary only   Patient was given the opportunity to ask questions.  Patient verbalized understanding of the plan and was able to repeat key elements of the plan.    No orders of the defined types were placed in this encounter.    Requested Prescriptions    No prescriptions requested or ordered in this encounter    Return in about 1 month (around 07/13/2024) for physical, labs.  Sula Leavy Rode, PA-C

## 2024-06-16 LAB — CYTOLOGY - PAP
Adequacy: ABSENT
Chlamydia: NEGATIVE
Comment: NEGATIVE
Comment: NEGATIVE
Comment: NEGATIVE
Comment: NORMAL
Diagnosis: NEGATIVE
High risk HPV: NEGATIVE
Neisseria Gonorrhea: NEGATIVE
Trichomonas: NEGATIVE

## 2024-06-17 ENCOUNTER — Ambulatory Visit: Payer: Self-pay

## 2024-06-17 LAB — CERVICOVAGINAL ANCILLARY ONLY
Bacterial Vaginitis (gardnerella): NEGATIVE
Candida Glabrata: NEGATIVE
Candida Vaginitis: NEGATIVE
Chlamydia: NEGATIVE
Comment: NEGATIVE
Comment: NEGATIVE
Comment: NEGATIVE
Comment: NEGATIVE
Comment: NEGATIVE
Comment: NORMAL
Neisseria Gonorrhea: NEGATIVE
Trichomonas: NEGATIVE
